# Patient Record
Sex: Male | Born: 1935 | Race: White | Hispanic: No | Marital: Married | Smoking: Never smoker
Health system: Southern US, Community
[De-identification: ages and names within clinical notes are randomized; demographics above are authoritative.]

## PROBLEM LIST (undated history)

## (undated) DIAGNOSIS — R188 Other ascites: Secondary | ICD-10-CM

## (undated) DIAGNOSIS — K7460 Unspecified cirrhosis of liver: Secondary | ICD-10-CM

## (undated) DIAGNOSIS — R112 Nausea with vomiting, unspecified: Secondary | ICD-10-CM

## (undated) DIAGNOSIS — Z9889 Other specified postprocedural states: Secondary | ICD-10-CM

## (undated) DIAGNOSIS — C439 Malignant melanoma of skin, unspecified: Secondary | ICD-10-CM

## (undated) DIAGNOSIS — D474 Osteomyelofibrosis: Secondary | ICD-10-CM

## (undated) HISTORY — PX: HERNIA REPAIR: SHX51

## (undated) HISTORY — PX: SPINE SURGERY: SHX786

## (undated) HISTORY — DX: Osteomyelofibrosis: D47.4

## (undated) HISTORY — DX: Unspecified cirrhosis of liver: K74.60

## (undated) HISTORY — DX: Malignant melanoma of skin, unspecified: C43.9

---

## 2000-02-01 ENCOUNTER — Encounter: Payer: Self-pay | Admitting: Neurosurgery

## 2000-02-02 ENCOUNTER — Inpatient Hospital Stay (HOSPITAL_COMMUNITY): Admission: RE | Admit: 2000-02-02 | Discharge: 2000-02-04 | Payer: Self-pay | Admitting: Neurosurgery

## 2000-02-02 ENCOUNTER — Encounter: Payer: Self-pay | Admitting: Neurosurgery

## 2000-02-28 ENCOUNTER — Encounter: Payer: Self-pay | Admitting: Neurosurgery

## 2004-05-24 ENCOUNTER — Ambulatory Visit: Payer: Self-pay | Admitting: Internal Medicine

## 2006-07-10 ENCOUNTER — Other Ambulatory Visit: Payer: Self-pay

## 2006-07-10 ENCOUNTER — Ambulatory Visit: Payer: Self-pay | Admitting: Urology

## 2006-07-16 ENCOUNTER — Ambulatory Visit: Payer: Self-pay | Admitting: Urology

## 2007-05-15 ENCOUNTER — Ambulatory Visit: Payer: Self-pay | Admitting: Gastroenterology

## 2008-02-24 ENCOUNTER — Ambulatory Visit: Payer: Self-pay | Admitting: Dermatology

## 2008-03-07 ENCOUNTER — Ambulatory Visit: Payer: Self-pay | Admitting: Internal Medicine

## 2008-03-09 ENCOUNTER — Ambulatory Visit: Payer: Self-pay | Admitting: Internal Medicine

## 2008-04-07 ENCOUNTER — Ambulatory Visit: Payer: Self-pay | Admitting: Internal Medicine

## 2008-05-07 ENCOUNTER — Ambulatory Visit: Payer: Self-pay | Admitting: Internal Medicine

## 2008-06-07 ENCOUNTER — Ambulatory Visit: Payer: Self-pay | Admitting: Internal Medicine

## 2008-07-07 ENCOUNTER — Ambulatory Visit: Payer: Self-pay | Admitting: Internal Medicine

## 2008-08-07 ENCOUNTER — Ambulatory Visit: Payer: Self-pay | Admitting: Internal Medicine

## 2008-09-07 ENCOUNTER — Ambulatory Visit: Payer: Self-pay | Admitting: Internal Medicine

## 2008-10-05 ENCOUNTER — Ambulatory Visit: Payer: Self-pay | Admitting: Internal Medicine

## 2008-10-14 ENCOUNTER — Ambulatory Visit: Payer: Self-pay | Admitting: Internal Medicine

## 2008-11-05 ENCOUNTER — Ambulatory Visit: Payer: Self-pay | Admitting: Internal Medicine

## 2008-12-05 ENCOUNTER — Ambulatory Visit: Payer: Self-pay | Admitting: Internal Medicine

## 2009-01-05 ENCOUNTER — Ambulatory Visit: Payer: Self-pay | Admitting: Internal Medicine

## 2009-02-04 ENCOUNTER — Ambulatory Visit: Payer: Self-pay | Admitting: Internal Medicine

## 2009-03-07 ENCOUNTER — Ambulatory Visit: Payer: Self-pay | Admitting: Internal Medicine

## 2009-04-07 ENCOUNTER — Ambulatory Visit: Payer: Self-pay | Admitting: Internal Medicine

## 2009-05-07 ENCOUNTER — Ambulatory Visit: Payer: Self-pay | Admitting: Internal Medicine

## 2009-06-07 ENCOUNTER — Ambulatory Visit: Payer: Self-pay | Admitting: Internal Medicine

## 2009-07-07 ENCOUNTER — Ambulatory Visit: Payer: Self-pay | Admitting: Internal Medicine

## 2009-07-08 ENCOUNTER — Ambulatory Visit: Payer: Self-pay | Admitting: Internal Medicine

## 2009-08-07 ENCOUNTER — Ambulatory Visit: Payer: Self-pay | Admitting: Internal Medicine

## 2009-09-07 ENCOUNTER — Ambulatory Visit: Payer: Self-pay | Admitting: Internal Medicine

## 2009-10-05 ENCOUNTER — Ambulatory Visit: Payer: Self-pay | Admitting: Internal Medicine

## 2009-11-05 ENCOUNTER — Ambulatory Visit: Payer: Self-pay | Admitting: Internal Medicine

## 2009-11-11 ENCOUNTER — Ambulatory Visit: Payer: Self-pay | Admitting: Internal Medicine

## 2009-12-05 ENCOUNTER — Ambulatory Visit: Payer: Self-pay | Admitting: Internal Medicine

## 2010-01-05 ENCOUNTER — Ambulatory Visit: Payer: Self-pay | Admitting: Internal Medicine

## 2010-02-04 ENCOUNTER — Ambulatory Visit: Payer: Self-pay | Admitting: Internal Medicine

## 2010-03-07 ENCOUNTER — Ambulatory Visit: Payer: Self-pay | Admitting: Internal Medicine

## 2010-03-16 ENCOUNTER — Ambulatory Visit: Payer: Self-pay | Admitting: Internal Medicine

## 2010-04-07 ENCOUNTER — Ambulatory Visit: Payer: Self-pay | Admitting: Internal Medicine

## 2010-05-07 ENCOUNTER — Ambulatory Visit: Payer: Self-pay | Admitting: Internal Medicine

## 2010-06-09 ENCOUNTER — Ambulatory Visit: Payer: Self-pay | Admitting: Internal Medicine

## 2010-07-07 ENCOUNTER — Ambulatory Visit: Payer: Self-pay | Admitting: Internal Medicine

## 2010-08-07 ENCOUNTER — Ambulatory Visit: Payer: Self-pay | Admitting: Internal Medicine

## 2010-09-07 ENCOUNTER — Ambulatory Visit: Payer: Self-pay | Admitting: Internal Medicine

## 2010-10-13 ENCOUNTER — Ambulatory Visit: Payer: Self-pay | Admitting: Internal Medicine

## 2010-11-06 ENCOUNTER — Ambulatory Visit: Payer: Self-pay | Admitting: Internal Medicine

## 2010-12-06 ENCOUNTER — Ambulatory Visit: Payer: Self-pay | Admitting: Internal Medicine

## 2011-01-06 ENCOUNTER — Ambulatory Visit: Payer: Self-pay | Admitting: Oncology

## 2011-01-06 ENCOUNTER — Ambulatory Visit: Payer: Self-pay | Admitting: Internal Medicine

## 2011-02-15 ENCOUNTER — Ambulatory Visit: Payer: Self-pay | Admitting: Internal Medicine

## 2011-03-08 ENCOUNTER — Ambulatory Visit: Payer: Self-pay | Admitting: Internal Medicine

## 2011-04-08 ENCOUNTER — Ambulatory Visit: Payer: Self-pay | Admitting: Internal Medicine

## 2011-05-08 ENCOUNTER — Ambulatory Visit: Payer: Self-pay | Admitting: Internal Medicine

## 2011-06-08 ENCOUNTER — Ambulatory Visit: Payer: Self-pay | Admitting: Internal Medicine

## 2011-07-08 ENCOUNTER — Ambulatory Visit: Payer: Self-pay | Admitting: Internal Medicine

## 2011-08-16 ENCOUNTER — Ambulatory Visit: Payer: Self-pay | Admitting: Internal Medicine

## 2011-08-16 LAB — CBC CANCER CENTER
Eosinophil %: 0.8 %
HCT: 31.2 % — ABNORMAL LOW (ref 40.0–52.0)
HGB: 10.5 g/dL — ABNORMAL LOW (ref 13.0–18.0)
Lymphocyte #: 1.7 x10 3/mm (ref 1.0–3.6)
Lymphocyte %: 16.4 %
MCH: 27.7 pg (ref 26.0–34.0)
MCV: 82 fL (ref 80–100)
Monocyte #: 0.6 x10 3/mm (ref 0.0–0.7)
Monocyte %: 5.8 %
Neutrophil #: 8 x10 3/mm — ABNORMAL HIGH (ref 1.4–6.5)
RBC: 3.8 10*6/uL — ABNORMAL LOW (ref 4.40–5.90)
RDW: 19.5 % — ABNORMAL HIGH (ref 11.5–14.5)
WBC: 10.5 x10 3/mm (ref 3.8–10.6)

## 2011-08-16 LAB — IRON AND TIBC
Iron Bind.Cap.(Total): 311 ug/dL (ref 250–450)
Unbound Iron-Bind.Cap.: 213 ug/dL

## 2011-09-08 ENCOUNTER — Ambulatory Visit: Payer: Self-pay | Admitting: Internal Medicine

## 2011-10-04 LAB — CBC CANCER CENTER
Basophil #: 0 x10 3/mm (ref 0.0–0.1)
Eosinophil #: 0.1 x10 3/mm (ref 0.0–0.7)
Eosinophil %: 0.7 %
HCT: 33 % — ABNORMAL LOW (ref 40.0–52.0)
HGB: 11.1 g/dL — ABNORMAL LOW (ref 13.0–18.0)
Lymphocyte #: 2.1 x10 3/mm (ref 1.0–3.6)
Lymphocyte %: 15.9 %
MCHC: 33.7 g/dL (ref 32.0–36.0)
Monocyte #: 0.7 x10 3/mm (ref 0.0–0.7)
Neutrophil %: 77.5 %
Platelet: 121 x10 3/mm — ABNORMAL LOW (ref 150–440)
RBC: 4.01 10*6/uL — ABNORMAL LOW (ref 4.40–5.90)
RDW: 20 % — ABNORMAL HIGH (ref 11.5–14.5)
WBC: 13.1 x10 3/mm — ABNORMAL HIGH (ref 3.8–10.6)

## 2011-10-06 ENCOUNTER — Ambulatory Visit: Payer: Self-pay | Admitting: Internal Medicine

## 2011-11-15 ENCOUNTER — Ambulatory Visit: Payer: Self-pay | Admitting: Oncology

## 2011-11-15 LAB — CANCER CENTER HEMOGLOBIN: HGB: 10.5 g/dL — ABNORMAL LOW (ref 13.0–18.0)

## 2011-12-06 ENCOUNTER — Ambulatory Visit: Payer: Self-pay | Admitting: Oncology

## 2012-01-03 LAB — COMPREHENSIVE METABOLIC PANEL
Albumin: 3.7 g/dL (ref 3.4–5.0)
Alkaline Phosphatase: 96 U/L (ref 50–136)
Bilirubin,Total: 0.5 mg/dL (ref 0.2–1.0)
Chloride: 105 mmol/L (ref 98–107)
Co2: 26 mmol/L (ref 21–32)
EGFR (African American): 52 — ABNORMAL LOW
EGFR (Non-African Amer.): 45 — ABNORMAL LOW
Osmolality: 289 (ref 275–301)
Potassium: 4.3 mmol/L (ref 3.5–5.1)
SGPT (ALT): 40 U/L

## 2012-01-03 LAB — CBC CANCER CENTER
Basophil %: 0.9 %
Eosinophil #: 0.1 x10 3/mm (ref 0.0–0.7)
Eosinophil %: 1.1 %
Lymphocyte %: 14.9 %
MCH: 26.7 pg (ref 26.0–34.0)
MCV: 82 fL (ref 80–100)
Neutrophil #: 7 x10 3/mm — ABNORMAL HIGH (ref 1.4–6.5)
Platelet: 122 x10 3/mm — ABNORMAL LOW (ref 150–440)
RBC: 3.84 10*6/uL — ABNORMAL LOW (ref 4.40–5.90)

## 2012-01-06 ENCOUNTER — Ambulatory Visit: Payer: Self-pay | Admitting: Oncology

## 2012-02-15 ENCOUNTER — Ambulatory Visit: Payer: Self-pay | Admitting: Oncology

## 2012-02-15 LAB — CBC CANCER CENTER
Basophil #: 0.1 x10 3/mm (ref 0.0–0.1)
Basophil %: 0.7 %
Eosinophil #: 0.1 x10 3/mm (ref 0.0–0.7)
Eosinophil %: 1.3 %
HCT: 32.4 % — ABNORMAL LOW (ref 40.0–52.0)
HGB: 10.5 g/dL — ABNORMAL LOW (ref 13.0–18.0)
Lymphocyte #: 1.4 x10 3/mm (ref 1.0–3.6)
Lymphocyte %: 15.2 %
MCH: 26.5 pg (ref 26.0–34.0)
MCHC: 32.3 g/dL (ref 32.0–36.0)
MCV: 82 fL (ref 80–100)
Monocyte #: 0.6 x10 3/mm (ref 0.2–1.0)
Monocyte %: 6.5 %
Neutrophil #: 6.8 x10 3/mm — ABNORMAL HIGH (ref 1.4–6.5)
Neutrophil %: 76.3 %
Platelet: 124 x10 3/mm — ABNORMAL LOW (ref 150–440)
RBC: 3.95 10*6/uL — ABNORMAL LOW (ref 4.40–5.90)
RDW: 19.3 % — ABNORMAL HIGH (ref 11.5–14.5)
WBC: 8.9 x10 3/mm (ref 3.8–10.6)

## 2012-02-15 LAB — BASIC METABOLIC PANEL
Anion Gap: 12 (ref 7–16)
BUN: 27 mg/dL — ABNORMAL HIGH (ref 7–18)
Calcium, Total: 8.9 mg/dL (ref 8.5–10.1)
Chloride: 104 mmol/L (ref 98–107)
Co2: 26 mmol/L (ref 21–32)
Creatinine: 1.6 mg/dL — ABNORMAL HIGH (ref 0.60–1.30)
EGFR (African American): 47 — ABNORMAL LOW
EGFR (Non-African Amer.): 40 — ABNORMAL LOW
Glucose: 108 mg/dL — ABNORMAL HIGH (ref 65–99)
Osmolality: 289 (ref 275–301)
Potassium: 4 mmol/L (ref 3.5–5.1)
Sodium: 142 mmol/L (ref 136–145)

## 2012-03-07 ENCOUNTER — Ambulatory Visit: Payer: Self-pay | Admitting: Oncology

## 2012-03-25 LAB — CBC CANCER CENTER
Basophil #: 0.2 x10 3/mm — ABNORMAL HIGH (ref 0.0–0.1)
Basophil %: 0.9 %
Eosinophil #: 0.1 x10 3/mm (ref 0.0–0.7)
Eosinophil %: 0.4 %
HGB: 11.5 g/dL — ABNORMAL LOW (ref 13.0–18.0)
Lymphocyte #: 2 x10 3/mm (ref 1.0–3.6)
Lymphocyte %: 11.9 %
MCH: 26.2 pg (ref 26.0–34.0)
MCHC: 32 g/dL (ref 32.0–36.0)
MCV: 82 fL (ref 80–100)
Monocyte #: 0.9 x10 3/mm (ref 0.2–1.0)
Neutrophil %: 81.7 %
Platelet: 107 x10 3/mm — ABNORMAL LOW (ref 150–440)
RBC: 4.38 10*6/uL — ABNORMAL LOW (ref 4.40–5.90)
RDW: 19.6 % — ABNORMAL HIGH (ref 11.5–14.5)
WBC: 17.1 x10 3/mm — ABNORMAL HIGH (ref 3.8–10.6)

## 2012-03-25 LAB — COMPREHENSIVE METABOLIC PANEL
Albumin: 3.7 g/dL (ref 3.4–5.0)
Anion Gap: 10 (ref 7–16)
BUN: 22 mg/dL — ABNORMAL HIGH (ref 7–18)
Bilirubin,Total: 0.5 mg/dL (ref 0.2–1.0)
Chloride: 103 mmol/L (ref 98–107)
Creatinine: 1.62 mg/dL — ABNORMAL HIGH (ref 0.60–1.30)
EGFR (Non-African Amer.): 39 — ABNORMAL LOW
Osmolality: 287 (ref 275–301)
SGOT(AST): 22 U/L (ref 15–37)
SGPT (ALT): 37 U/L (ref 12–78)
Total Protein: 7 g/dL (ref 6.4–8.2)

## 2012-04-07 ENCOUNTER — Ambulatory Visit: Payer: Self-pay | Admitting: Oncology

## 2012-05-08 ENCOUNTER — Ambulatory Visit: Payer: Self-pay | Admitting: Oncology

## 2012-05-08 LAB — CBC CANCER CENTER
Basophil #: 0.1 x10 3/mm (ref 0.0–0.1)
Eosinophil #: 0.1 x10 3/mm (ref 0.0–0.7)
Eosinophil %: 0.3 %
Lymphocyte #: 2.3 x10 3/mm (ref 1.0–3.6)
Lymphocyte %: 10.7 %
MCHC: 32.3 g/dL (ref 32.0–36.0)
MCV: 83 fL (ref 80–100)
Monocyte %: 4.7 %
Neutrophil #: 18 x10 3/mm — ABNORMAL HIGH (ref 1.4–6.5)
Platelet: 105 x10 3/mm — ABNORMAL LOW (ref 150–440)
RBC: 4.52 10*6/uL (ref 4.40–5.90)
WBC: 21.5 x10 3/mm — ABNORMAL HIGH (ref 3.8–10.6)

## 2012-05-08 LAB — COMPREHENSIVE METABOLIC PANEL
Albumin: 3.9 g/dL (ref 3.4–5.0)
Anion Gap: 12 (ref 7–16)
BUN: 26 mg/dL — ABNORMAL HIGH (ref 7–18)
Chloride: 101 mmol/L (ref 98–107)
Co2: 25 mmol/L (ref 21–32)
Creatinine: 1.59 mg/dL — ABNORMAL HIGH (ref 0.60–1.30)
Glucose: 151 mg/dL — ABNORMAL HIGH (ref 65–99)
Osmolality: 283 (ref 275–301)
Potassium: 3.9 mmol/L (ref 3.5–5.1)
SGOT(AST): 24 U/L (ref 15–37)
Sodium: 138 mmol/L (ref 136–145)
Total Protein: 7.4 g/dL (ref 6.4–8.2)

## 2012-06-07 ENCOUNTER — Ambulatory Visit: Payer: Self-pay | Admitting: Oncology

## 2012-06-19 LAB — COMPREHENSIVE METABOLIC PANEL
Albumin: 3.7 g/dL (ref 3.4–5.0)
Alkaline Phosphatase: 100 U/L (ref 50–136)
Anion Gap: 11 (ref 7–16)
Calcium, Total: 8.7 mg/dL (ref 8.5–10.1)
Co2: 25 mmol/L (ref 21–32)
EGFR (African American): 48 — ABNORMAL LOW
Glucose: 117 mg/dL — ABNORMAL HIGH (ref 65–99)
Potassium: 4.1 mmol/L (ref 3.5–5.1)
SGOT(AST): 28 U/L (ref 15–37)
SGPT (ALT): 29 U/L (ref 12–78)
Sodium: 138 mmol/L (ref 136–145)

## 2012-06-19 LAB — CBC CANCER CENTER
Basophil: 2 %
Eosinophil: 1 %
HCT: 33.8 % — ABNORMAL LOW (ref 40.0–52.0)
HGB: 10.9 g/dL — ABNORMAL LOW (ref 13.0–18.0)
MCHC: 32.3 g/dL (ref 32.0–36.0)
Metamyelocyte: 6 %
Monocytes: 4 %
Myelocyte: 9 %
Promyelocyte: 2 %
RDW: 18.8 % — ABNORMAL HIGH (ref 11.5–14.5)
WBC: 12.2 x10 3/mm — ABNORMAL HIGH (ref 3.8–10.6)

## 2012-07-07 ENCOUNTER — Ambulatory Visit: Payer: Self-pay | Admitting: Oncology

## 2012-07-24 LAB — CBC CANCER CENTER
Bands: 24 %
Eosinophil: 1 %
HCT: 33.5 % — ABNORMAL LOW (ref 40.0–52.0)
Lymphocytes: 8 %
MCHC: 33 g/dL (ref 32.0–36.0)
MCV: 82 fL (ref 80–100)
Metamyelocyte: 9 %
Myelocyte: 4 %
Platelet: 106 x10 3/mm — ABNORMAL LOW (ref 150–440)
Promyelocyte: 1 %
RBC: 4.1 10*6/uL — ABNORMAL LOW (ref 4.40–5.90)
WBC: 20.9 x10 3/mm — ABNORMAL HIGH (ref 3.8–10.6)

## 2012-07-24 LAB — COMPREHENSIVE METABOLIC PANEL
Albumin: 3.8 g/dL (ref 3.4–5.0)
BUN: 23 mg/dL — ABNORMAL HIGH (ref 7–18)
Bilirubin,Total: 0.4 mg/dL (ref 0.2–1.0)
Co2: 25 mmol/L (ref 21–32)
Creatinine: 1.64 mg/dL — ABNORMAL HIGH (ref 0.60–1.30)
Glucose: 114 mg/dL — ABNORMAL HIGH (ref 65–99)
Potassium: 3.9 mmol/L (ref 3.5–5.1)
SGOT(AST): 30 U/L (ref 15–37)
SGPT (ALT): 31 U/L (ref 12–78)
Total Protein: 7.5 g/dL (ref 6.4–8.2)

## 2012-08-07 ENCOUNTER — Ambulatory Visit: Payer: Self-pay | Admitting: Oncology

## 2012-10-05 ENCOUNTER — Ambulatory Visit: Payer: Self-pay | Admitting: Oncology

## 2012-10-16 LAB — COMPREHENSIVE METABOLIC PANEL
Alkaline Phosphatase: 96 U/L (ref 50–136)
Anion Gap: 11 (ref 7–16)
Bilirubin,Total: 0.5 mg/dL (ref 0.2–1.0)
Calcium, Total: 8.5 mg/dL (ref 8.5–10.1)
Co2: 26 mmol/L (ref 21–32)
EGFR (African American): 51 — ABNORMAL LOW
Potassium: 4 mmol/L (ref 3.5–5.1)
SGOT(AST): 30 U/L (ref 15–37)
SGPT (ALT): 34 U/L (ref 12–78)
Sodium: 140 mmol/L (ref 136–145)

## 2012-10-16 LAB — CBC CANCER CENTER
Basophil #: 0.2 x10 3/mm — ABNORMAL HIGH (ref 0.0–0.1)
Basophil %: 1.4 %
Eosinophil %: 1.1 %
HCT: 33.7 % — ABNORMAL LOW (ref 40.0–52.0)
HGB: 10.9 g/dL — ABNORMAL LOW (ref 13.0–18.0)
Lymphocyte #: 1.4 x10 3/mm (ref 1.0–3.6)
Lymphocyte %: 12.8 %
MCH: 26.3 pg (ref 26.0–34.0)
MCV: 81 fL (ref 80–100)
Monocyte #: 0.7 x10 3/mm (ref 0.2–1.0)
Monocyte %: 6.6 %
Neutrophil #: 8.7 x10 3/mm — ABNORMAL HIGH (ref 1.4–6.5)
Neutrophil %: 78.1 %
Platelet: 114 x10 3/mm — ABNORMAL LOW (ref 150–440)
RBC: 4.14 10*6/uL — ABNORMAL LOW (ref 4.40–5.90)

## 2012-11-05 ENCOUNTER — Ambulatory Visit: Payer: Self-pay | Admitting: Oncology

## 2013-01-05 ENCOUNTER — Ambulatory Visit: Payer: Self-pay | Admitting: Oncology

## 2013-01-15 LAB — COMPREHENSIVE METABOLIC PANEL
Albumin: 3.6 g/dL (ref 3.4–5.0)
Alkaline Phosphatase: 101 U/L (ref 50–136)
Anion Gap: 8 (ref 7–16)
BUN: 24 mg/dL — ABNORMAL HIGH (ref 7–18)
Bilirubin,Total: 0.4 mg/dL (ref 0.2–1.0)
Chloride: 104 mmol/L (ref 98–107)
Co2: 27 mmol/L (ref 21–32)
SGOT(AST): 29 U/L (ref 15–37)
SGPT (ALT): 30 U/L (ref 12–78)
Total Protein: 7 g/dL (ref 6.4–8.2)

## 2013-01-15 LAB — CBC CANCER CENTER
Basophil #: 0 x10 3/mm (ref 0.0–0.1)
Basophil %: 0.2 %
Eosinophil #: 0.2 x10 3/mm (ref 0.0–0.7)
Eosinophil %: 1.2 %
HCT: 33.2 % — ABNORMAL LOW (ref 40.0–52.0)
Lymphocyte %: 11 %
MCH: 26.6 pg (ref 26.0–34.0)
MCV: 80 fL (ref 80–100)
Neutrophil #: 12.4 x10 3/mm — ABNORMAL HIGH (ref 1.4–6.5)
Platelet: 130 x10 3/mm — ABNORMAL LOW (ref 150–440)
RBC: 4.13 10*6/uL — ABNORMAL LOW (ref 4.40–5.90)
RDW: 18.6 % — ABNORMAL HIGH (ref 11.5–14.5)
WBC: 15.1 x10 3/mm — ABNORMAL HIGH (ref 3.8–10.6)

## 2013-02-04 ENCOUNTER — Ambulatory Visit: Payer: Self-pay | Admitting: Oncology

## 2013-04-09 ENCOUNTER — Ambulatory Visit: Payer: Self-pay | Admitting: Otolaryngology

## 2013-04-24 ENCOUNTER — Ambulatory Visit: Payer: Self-pay | Admitting: Otolaryngology

## 2013-05-22 ENCOUNTER — Ambulatory Visit: Payer: Self-pay | Admitting: Oncology

## 2013-05-22 LAB — CBC CANCER CENTER
Blast: 1 %
Eosinophil: 1 %
HCT: 33 % — ABNORMAL LOW (ref 40.0–52.0)
HGB: 11.2 g/dL — ABNORMAL LOW (ref 13.0–18.0)
Lymphocytes: 13 %
MCH: 26.5 pg (ref 26.0–34.0)
MCHC: 33.9 g/dL (ref 32.0–36.0)
Metamyelocyte: 5 %
Monocytes: 7 %
NRBC/100 WBC: 2 /100
Promyelocyte: 2 %
RBC: 4.21 10*6/uL — ABNORMAL LOW (ref 4.40–5.90)
RDW: 18.7 % — ABNORMAL HIGH (ref 11.5–14.5)
Segmented Neutrophils: 40 %
WBC: 20.1 x10 3/mm — ABNORMAL HIGH (ref 3.8–10.6)

## 2013-05-22 LAB — COMPREHENSIVE METABOLIC PANEL
Alkaline Phosphatase: 98 U/L (ref 50–136)
Anion Gap: 4 — ABNORMAL LOW (ref 7–16)
Calcium, Total: 9 mg/dL (ref 8.5–10.1)
Co2: 27 mmol/L (ref 21–32)
Creatinine: 1.48 mg/dL — ABNORMAL HIGH (ref 0.60–1.30)
EGFR (Non-African Amer.): 44 — ABNORMAL LOW
Glucose: 130 mg/dL — ABNORMAL HIGH (ref 65–99)
Osmolality: 277 (ref 275–301)
Potassium: 3.9 mmol/L (ref 3.5–5.1)
SGOT(AST): 38 U/L — ABNORMAL HIGH (ref 15–37)
SGPT (ALT): 32 U/L (ref 12–78)
Total Protein: 7 g/dL (ref 6.4–8.2)

## 2013-06-07 ENCOUNTER — Ambulatory Visit: Payer: Self-pay | Admitting: Oncology

## 2013-09-24 ENCOUNTER — Ambulatory Visit: Payer: Self-pay | Admitting: Oncology

## 2013-09-24 LAB — CBC CANCER CENTER
BASOS ABS: 0.5 x10 3/mm — AB (ref 0.0–0.1)
Basophil %: 2.3 %
EOS PCT: 1.2 %
Eosinophil #: 0.3 x10 3/mm (ref 0.0–0.7)
HCT: 34.3 % — ABNORMAL LOW (ref 40.0–52.0)
HGB: 10.8 g/dL — ABNORMAL LOW (ref 13.0–18.0)
LYMPHS ABS: 1.9 x10 3/mm (ref 1.0–3.6)
Lymphocyte %: 8.8 %
MCH: 25.3 pg — AB (ref 26.0–34.0)
MCHC: 31.5 g/dL — ABNORMAL LOW (ref 32.0–36.0)
MCV: 80 fL (ref 80–100)
MONOS PCT: 5.8 %
Monocyte #: 1.2 x10 3/mm — ABNORMAL HIGH (ref 0.2–1.0)
Neutrophil #: 17.4 x10 3/mm — ABNORMAL HIGH (ref 1.4–6.5)
Neutrophil %: 81.9 %
Platelet: 124 x10 3/mm — ABNORMAL LOW (ref 150–440)
RBC: 4.28 10*6/uL — ABNORMAL LOW (ref 4.40–5.90)
RDW: 18.5 % — AB (ref 11.5–14.5)
WBC: 21.2 x10 3/mm — ABNORMAL HIGH (ref 3.8–10.6)

## 2013-09-24 LAB — COMPREHENSIVE METABOLIC PANEL
ANION GAP: 10 (ref 7–16)
Albumin: 3.4 g/dL (ref 3.4–5.0)
Alkaline Phosphatase: 111 U/L
BUN: 20 mg/dL — ABNORMAL HIGH (ref 7–18)
Bilirubin,Total: 0.3 mg/dL (ref 0.2–1.0)
CALCIUM: 8 mg/dL — AB (ref 8.5–10.1)
Chloride: 102 mmol/L (ref 98–107)
Co2: 26 mmol/L (ref 21–32)
Creatinine: 1.53 mg/dL — ABNORMAL HIGH (ref 0.60–1.30)
EGFR (African American): 49 — ABNORMAL LOW
EGFR (Non-African Amer.): 42 — ABNORMAL LOW
Glucose: 140 mg/dL — ABNORMAL HIGH (ref 65–99)
OSMOLALITY: 281 (ref 275–301)
Potassium: 4.3 mmol/L (ref 3.5–5.1)
SGOT(AST): 36 U/L (ref 15–37)
SGPT (ALT): 40 U/L (ref 12–78)
Sodium: 138 mmol/L (ref 136–145)
Total Protein: 6.9 g/dL (ref 6.4–8.2)

## 2013-10-05 ENCOUNTER — Ambulatory Visit: Payer: Self-pay | Admitting: Oncology

## 2014-02-05 ENCOUNTER — Ambulatory Visit: Payer: Self-pay | Admitting: Oncology

## 2014-02-05 LAB — CBC CANCER CENTER
BASOS PCT: 1.9 %
Basophil #: 0.5 x10 3/mm — ABNORMAL HIGH (ref 0.0–0.1)
Eosinophil #: 0.4 x10 3/mm (ref 0.0–0.7)
Eosinophil %: 1.7 %
HCT: 34.6 % — AB (ref 40.0–52.0)
HGB: 11.2 g/dL — ABNORMAL LOW (ref 13.0–18.0)
Lymphocyte #: 2.5 x10 3/mm (ref 1.0–3.6)
Lymphocyte %: 10 %
MCH: 25.8 pg — ABNORMAL LOW (ref 26.0–34.0)
MCHC: 32.4 g/dL (ref 32.0–36.0)
MCV: 79 fL — AB (ref 80–100)
MONOS PCT: 5.6 %
Monocyte #: 1.4 x10 3/mm — ABNORMAL HIGH (ref 0.2–1.0)
NEUTROS ABS: 20 x10 3/mm — AB (ref 1.4–6.5)
Neutrophil %: 80.8 %
Platelet: 126 x10 3/mm — ABNORMAL LOW (ref 150–440)
RBC: 4.36 10*6/uL — ABNORMAL LOW (ref 4.40–5.90)
RDW: 18.9 % — ABNORMAL HIGH (ref 11.5–14.5)
WBC: 24.8 x10 3/mm — ABNORMAL HIGH (ref 3.8–10.6)

## 2014-02-05 LAB — COMPREHENSIVE METABOLIC PANEL
ALBUMIN: 3.5 g/dL (ref 3.4–5.0)
ALT: 43 U/L (ref 12–78)
Alkaline Phosphatase: 118 U/L — ABNORMAL HIGH
Anion Gap: 7 (ref 7–16)
BUN: 23 mg/dL — ABNORMAL HIGH (ref 7–18)
Bilirubin,Total: 0.4 mg/dL (ref 0.2–1.0)
CALCIUM: 8.7 mg/dL (ref 8.5–10.1)
CHLORIDE: 104 mmol/L (ref 98–107)
Co2: 28 mmol/L (ref 21–32)
Creatinine: 1.47 mg/dL — ABNORMAL HIGH (ref 0.60–1.30)
EGFR (African American): 51 — ABNORMAL LOW
GFR CALC NON AF AMER: 44 — AB
Glucose: 107 mg/dL — ABNORMAL HIGH (ref 65–99)
OSMOLALITY: 282 (ref 275–301)
Potassium: 4.3 mmol/L (ref 3.5–5.1)
SGOT(AST): 42 U/L — ABNORMAL HIGH (ref 15–37)
Sodium: 139 mmol/L (ref 136–145)
Total Protein: 7.2 g/dL (ref 6.4–8.2)

## 2014-03-07 ENCOUNTER — Ambulatory Visit: Payer: Self-pay | Admitting: Oncology

## 2014-08-12 ENCOUNTER — Ambulatory Visit: Payer: Self-pay | Admitting: Oncology

## 2014-08-12 LAB — COMPREHENSIVE METABOLIC PANEL
ALK PHOS: 150 U/L — AB
ALT: 43 U/L
Albumin: 3.3 g/dL — ABNORMAL LOW (ref 3.4–5.0)
Anion Gap: 10 (ref 7–16)
BILIRUBIN TOTAL: 0.4 mg/dL (ref 0.2–1.0)
BUN: 22 mg/dL — ABNORMAL HIGH (ref 7–18)
CALCIUM: 8.4 mg/dL — AB (ref 8.5–10.1)
CHLORIDE: 104 mmol/L (ref 98–107)
CO2: 25 mmol/L (ref 21–32)
CREATININE: 1.32 mg/dL — AB (ref 0.60–1.30)
GFR CALC NON AF AMER: 55 — AB
Glucose: 123 mg/dL — ABNORMAL HIGH (ref 65–99)
Osmolality: 282 (ref 275–301)
Potassium: 4 mmol/L (ref 3.5–5.1)
SGOT(AST): 40 U/L — ABNORMAL HIGH (ref 15–37)
Sodium: 139 mmol/L (ref 136–145)
Total Protein: 6.8 g/dL (ref 6.4–8.2)

## 2014-08-12 LAB — CBC CANCER CENTER
BANDS NEUTROPHIL: 11 %
Basophil: 3 %
Eosinophil: 1 %
HCT: 32.1 % — ABNORMAL LOW (ref 40.0–52.0)
HGB: 10.3 g/dL — ABNORMAL LOW (ref 13.0–18.0)
Lymphocytes: 9 %
MCH: 24.8 pg — ABNORMAL LOW (ref 26.0–34.0)
MCHC: 32.2 g/dL (ref 32.0–36.0)
MCV: 77 fL — ABNORMAL LOW (ref 80–100)
MYELOCYTE: 8 %
Metamyelocyte: 6 %
Monocytes: 2 %
NRBC/100 WBC: 4 /100
OTHER CELLS BLOOD: 2 %
PLATELETS: 122 x10 3/mm — AB (ref 150–440)
Promyelocyte: 2 %
RBC: 4.17 10*6/uL — AB (ref 4.40–5.90)
RDW: 19.2 % — ABNORMAL HIGH (ref 11.5–14.5)
Segmented Neutrophils: 54 %
Variant Lymphocyte: 2 %
WBC: 25.9 x10 3/mm — ABNORMAL HIGH (ref 3.8–10.6)

## 2014-09-07 ENCOUNTER — Ambulatory Visit: Payer: Self-pay | Admitting: Oncology

## 2014-11-23 ENCOUNTER — Ambulatory Visit
Admit: 2014-11-23 | Disposition: A | Discharge status: Home / Self Care | Payer: Self-pay | Attending: Oncology | Admitting: Oncology

## 2014-11-23 LAB — COMPREHENSIVE METABOLIC PANEL
ALT: 22 U/L
AST: 27 U/L
Albumin: 3.7 g/dL
Alkaline Phosphatase: 120 U/L
Anion Gap: 10 (ref 7–16)
BUN: 20 mg/dL
Bilirubin,Total: 0.8 mg/dL
CALCIUM: 8.5 mg/dL — AB
Chloride: 102 mmol/L
Co2: 24 mmol/L
Creatinine: 1.25 mg/dL — ABNORMAL HIGH
EGFR (African American): >60
GFR CALC NON AF AMER: 53 — AB
Glucose: 114 mg/dL — ABNORMAL HIGH
Potassium: 4.7 mmol/L
SODIUM: 136 mmol/L
TOTAL PROTEIN: 7 g/dL

## 2014-11-23 LAB — CBC CANCER CENTER
BASOS ABS: 5 %
Bands: 12 %
Blast: 2 %
Eosinophil: 1 %
HCT: 31.7 % — AB (ref 40.0–52.0)
HGB: 10.1 g/dL — ABNORMAL LOW (ref 13.0–18.0)
Lymphocytes: 14 %
MCH: 24.2 pg — AB (ref 26.0–34.0)
MCHC: 31.8 g/dL — ABNORMAL LOW (ref 32.0–36.0)
MCV: 76 fL — AB (ref 80–100)
METAMYELOCYTE: 4 %
MONOS PCT: 7 %
Myelocyte: 13 %
NRBC/100 WBC: 4 /100
Platelet: 112 x10 3/mm — ABNORMAL LOW (ref 150–440)
RBC: 4.17 10*6/uL — AB (ref 4.40–5.90)
RDW: 19.1 % — ABNORMAL HIGH (ref 11.5–14.5)
Segmented Neutrophils: 40 %
VARIANT LYMPHOCYTE - H4-RLYMPH: 2 %
WBC: 30 x10 3/mm — AB (ref 3.8–10.6)

## 2014-11-25 LAB — PROTIME-INR
INR: 1.1
PROTHROMBIN TIME: 14 s

## 2014-11-25 LAB — APTT: ACTIVATED PTT: 40.3 s — AB (ref 23.6–35.9)

## 2014-11-26 ENCOUNTER — Ambulatory Visit
Admit: 2014-11-26 | Disposition: A | Discharge status: Home / Self Care | Payer: Self-pay | Attending: Oncology | Admitting: Oncology

## 2014-11-26 LAB — PROTEIN, BODY FLUID: Protein, Body Fluid: <3 g/dL

## 2014-11-26 LAB — BODY FLUID CELL COUNT WITH DIFFERENTIAL
Basophil: 0 %
Eosinophil: 0 %
Lymphocytes: 57 %
Neutrophils: 9 %
Nucleated Cell Count: 585 /mm3
OTHER CELLS BF: 0 %
Other Mononuclear Cells: 34 %

## 2014-11-26 LAB — CANCER ANTIGEN 19-9: CA 19-9: 7 U/mL (ref 0–35)

## 2014-11-26 LAB — CEA: CEA: 0.9 ng/mL (ref 0.0–4.7)

## 2014-12-01 LAB — CYTOLOGY - NON PAP

## 2014-12-10 ENCOUNTER — Telehealth: Payer: Self-pay | Admitting: *Deleted

## 2014-12-10 NOTE — Telephone Encounter (Signed)
Was told he would get a call after discussing case at conference and he is waiting to hear something back regarding an appt or what is to be done

## 2014-12-10 NOTE — Telephone Encounter (Signed)
See md Monday and then will schedule next appt VO Dr Oliva Bustard / bce Order entered to schedule pt

## 2014-12-14 ENCOUNTER — Ambulatory Visit: Payer: Self-pay | Admitting: Oncology

## 2014-12-15 ENCOUNTER — Other Ambulatory Visit: Payer: Self-pay | Admitting: Oncology

## 2014-12-15 ENCOUNTER — Inpatient Hospital Stay: Payer: Medicare Other | Attending: Oncology | Admitting: Oncology

## 2014-12-15 ENCOUNTER — Other Ambulatory Visit: Payer: Self-pay | Admitting: *Deleted

## 2014-12-15 VITALS — BP 131/66 | HR 84 | Temp 95.7°F | Wt 174.6 lb

## 2014-12-15 DIAGNOSIS — R188 Other ascites: Secondary | ICD-10-CM

## 2014-12-15 DIAGNOSIS — I2510 Atherosclerotic heart disease of native coronary artery without angina pectoris: Secondary | ICD-10-CM

## 2014-12-15 DIAGNOSIS — I517 Cardiomegaly: Secondary | ICD-10-CM | POA: Diagnosis not present

## 2014-12-15 DIAGNOSIS — R630 Anorexia: Secondary | ICD-10-CM | POA: Insufficient documentation

## 2014-12-15 DIAGNOSIS — R109 Unspecified abdominal pain: Secondary | ICD-10-CM | POA: Insufficient documentation

## 2014-12-15 DIAGNOSIS — R5383 Other fatigue: Secondary | ICD-10-CM | POA: Insufficient documentation

## 2014-12-15 DIAGNOSIS — R972 Elevated prostate specific antigen [PSA]: Secondary | ICD-10-CM | POA: Insufficient documentation

## 2014-12-15 DIAGNOSIS — J8410 Pulmonary fibrosis, unspecified: Secondary | ICD-10-CM | POA: Insufficient documentation

## 2014-12-15 DIAGNOSIS — R634 Abnormal weight loss: Secondary | ICD-10-CM | POA: Insufficient documentation

## 2014-12-15 DIAGNOSIS — R140 Abdominal distension (gaseous): Secondary | ICD-10-CM | POA: Insufficient documentation

## 2014-12-15 DIAGNOSIS — Z79899 Other long term (current) drug therapy: Secondary | ICD-10-CM | POA: Diagnosis not present

## 2014-12-15 DIAGNOSIS — R531 Weakness: Secondary | ICD-10-CM | POA: Insufficient documentation

## 2014-12-15 DIAGNOSIS — D474 Osteomyelofibrosis: Secondary | ICD-10-CM | POA: Insufficient documentation

## 2014-12-15 MED ORDER — TORSEMIDE 20 MG PO TABS: 40.0000 mg | ORAL_TABLET | Freq: Every day | ORAL | Status: DC

## 2014-12-16 ENCOUNTER — Other Ambulatory Visit: Payer: Self-pay | Admitting: Interventional Radiology

## 2014-12-17 ENCOUNTER — Other Ambulatory Visit: Payer: Self-pay | Admitting: Oncology

## 2014-12-17 ENCOUNTER — Ambulatory Visit
Admission: RE | Admit: 2014-12-17 | Discharge: 2014-12-17 | Disposition: A | Discharge status: Home / Self Care | Payer: Medicare Other | Source: Ambulatory Visit | Attending: Oncology | Admitting: Oncology

## 2014-12-17 DIAGNOSIS — R188 Other ascites: Secondary | ICD-10-CM | POA: Diagnosis present

## 2014-12-22 LAB — CYTOLOGY - NON PAP

## 2014-12-23 LAB — PATHOLOGY

## 2014-12-24 ENCOUNTER — Inpatient Hospital Stay (HOSPITAL_BASED_OUTPATIENT_CLINIC_OR_DEPARTMENT_OTHER): Payer: Medicare Other | Admitting: Oncology

## 2014-12-24 VITALS — BP 108/60 | HR 82 | Temp 95.1°F | Wt 160.7 lb

## 2014-12-24 DIAGNOSIS — D474 Osteomyelofibrosis: Secondary | ICD-10-CM

## 2014-12-24 DIAGNOSIS — R109 Unspecified abdominal pain: Secondary | ICD-10-CM

## 2014-12-24 DIAGNOSIS — I517 Cardiomegaly: Secondary | ICD-10-CM

## 2014-12-24 DIAGNOSIS — R630 Anorexia: Secondary | ICD-10-CM

## 2014-12-24 DIAGNOSIS — C801 Malignant (primary) neoplasm, unspecified: Principal | ICD-10-CM

## 2014-12-24 DIAGNOSIS — J8410 Pulmonary fibrosis, unspecified: Secondary | ICD-10-CM

## 2014-12-24 DIAGNOSIS — R634 Abnormal weight loss: Secondary | ICD-10-CM

## 2014-12-24 DIAGNOSIS — R140 Abdominal distension (gaseous): Secondary | ICD-10-CM | POA: Diagnosis not present

## 2014-12-24 DIAGNOSIS — R188 Other ascites: Secondary | ICD-10-CM | POA: Diagnosis not present

## 2014-12-24 DIAGNOSIS — C786 Secondary malignant neoplasm of retroperitoneum and peritoneum: Secondary | ICD-10-CM

## 2014-12-24 DIAGNOSIS — R5383 Other fatigue: Secondary | ICD-10-CM

## 2014-12-24 DIAGNOSIS — R531 Weakness: Secondary | ICD-10-CM

## 2014-12-24 DIAGNOSIS — I2510 Atherosclerotic heart disease of native coronary artery without angina pectoris: Secondary | ICD-10-CM

## 2014-12-24 DIAGNOSIS — Z79899 Other long term (current) drug therapy: Secondary | ICD-10-CM

## 2014-12-25 ENCOUNTER — Encounter
Admission: RE | Admit: 2014-12-25 | Discharge: 2014-12-25 | Disposition: A | Discharge status: Home / Self Care | Payer: Medicare Other | Source: Ambulatory Visit | Attending: Oncology | Admitting: Oncology

## 2014-12-25 ENCOUNTER — Encounter: Payer: Self-pay | Admitting: Oncology

## 2014-12-25 DIAGNOSIS — C786 Secondary malignant neoplasm of retroperitoneum and peritoneum: Secondary | ICD-10-CM | POA: Diagnosis present

## 2014-12-25 DIAGNOSIS — E079 Disorder of thyroid, unspecified: Secondary | ICD-10-CM | POA: Insufficient documentation

## 2014-12-25 DIAGNOSIS — R188 Other ascites: Secondary | ICD-10-CM | POA: Diagnosis present

## 2014-12-25 DIAGNOSIS — C801 Malignant (primary) neoplasm, unspecified: Secondary | ICD-10-CM | POA: Diagnosis present

## 2014-12-25 DIAGNOSIS — D474 Osteomyelofibrosis: Secondary | ICD-10-CM | POA: Insufficient documentation

## 2014-12-25 LAB — GLUCOSE, CAPILLARY: Glucose-Capillary: 101 mg/dL — ABNORMAL HIGH (ref 65–99)

## 2014-12-25 MED ORDER — FLUDEOXYGLUCOSE F - 18 (FDG) INJECTION
12.7500 | Freq: Once | INTRAVENOUS | Status: AC | PRN
  Administered 2014-12-25: 12.75 via INTRAVENOUS

## 2014-12-25 NOTE — Progress Notes (Signed)
[place] @ [facility] [phone]  Fax:([phone]) [place] OB: 09-29-1929  MR#: [id number]  OZD#:[id number]  Patient Care Team: Tracie Harrier, MD as PCP - General (Internal Medicine)  CHIEF COMPLAINT:  Chief Complaint  Patient presents with  . Follow-up      Megakaryocytic myelosclerosis   12/25/2014 Initial Diagnosis Megakaryocytic myelosclerosis    No flowsheet data found.  INTERVAL HISTORY: 81 year old gentleman with recurrent ascites.  Second attempt at abdominal paracentesis was negative for any malignant  Cells.. Patient continues to have abdominal discomfort, abdominal distention.  Losing weight.  Appetite is poor.  No rectal bleeding.  No vomiting.  No diarrhea.  REVIEW OF SYSTEMS:   Gen. status: Patient is feeling weak tired.  Declining performance status.  HEENT: No headache no dizziness no soreness in the mouth.  Lungs: No shortness of breath at rest.  Shortness of breath on exertion.  Cardiac: No palpitation.  GI: He recurrent abdominal distention with ascites.  Lower extremity edema.  Neurological system no headache no dizziness.  Skin: No rash.  Musculoskeletal system low back pain.  All other systems have been reviewed been reported to be negative  As per HPI. Otherwise, a complete review of systems is negatve.  PAST MEDICAL HISTORY: Past Medical History  Diagnosis Date  . Cancer     PAST SURGICAL HISTORY: No past surgical history on file.  FAMILY HISTORY No family history on file.  GYNECOLOGIC HISTORY:  No LMP for male patient.     ADVANCED DIRECTIVES:    HEALTH MAINTENANCE: History  Substance Use Topics  . Smoking status: Former Research scientist (life sciences)  . Smokeless tobacco: Not on file  . Alcohol Use: Not on file     Colonoscopy:  PAP:  Bone density:  Lipid panel:  No Known Allergies  Current Outpatient Prescriptions  Medication Sig Dispense Refill  . HYDROcodone-homatropine (HYCODAN) 5-1.5 MG/5ML syrup Take by mouth.    .  levothyroxine (SYNTHROID, LEVOTHROID) 88 MCG tablet Take by mouth.    . spironolactone (ALDACTONE) 50 MG tablet Take 50 mg by mouth daily.     No current facility-administered medications for this visit.    OBJECTIVE:  Filed Vitals:   12/24/14 1110  BP: 108/60  Pulse: 82  Temp: 95.1 F (35.1 C)     Body mass index is 22.43 kg/(m^2).    ECOG FS:2 - Symptomatic, <50% confined to bed  PHYSICAL EXAM: Gen. status: Alert and oriented individual not any acute distress.Head exam was generally normal. There was no scleral icterus or corneal arcus. Mucous membranes were moist..  Examination of the chest was unremarkable. There were no bony deformities, no asymmetry, and no other abnormalities.. Cardiac: Tachycardia.Cardiac exam revealed the PMI to be normally situated and sized. The rhythm was regular and no extrasystoles were noted during several minutes of auscultation. The first and second heart sounds were normal and physiologic splitting of the second heart sound was noted. There were no murmurs, rubs, clicks, or gallops.  Abdomen: Ascites.  Palpable spleen.  No other palpable masses.  Umbilical hernia.  Over extremity edema.Examination of the skin revealed no evidence of significant rashes, suspicious appearing nevi or other concerning lesions.Neurologically, the patient was awake, alert, and oriented to person, place and time. There were no obvious focal neurologic abnormalities.   LAB RESULTS:  No visits with results within 3 Day(s) from this visit. Latest known visit with results is:  Hospital Outpatient Visit on 12/17/2014  Component Date Value Ref Range Status  .  CYTOLOGY - NON GYN 12/17/2014    Final                   Value:Cytology - Non PAP CASE: [id number] PATIENT: Randy Stanton Non-Gyn Cytology Report     SPECIMEN SUBMITTED: A. peritoneal fluid, abdominal  CLINICAL HISTORY: None provided  PRE-OPERATIVE DIAGNOSIS: None Provided; None Provided  POST-OPERATIVE  DIAGNOSIS:      DIAGNOSIS: A. PERITONEAL FLUID; PARACENTESIS: - NEGATIVE FOR MALIGNANCY. - REACTIVE MESOTHELIAL CELLS WITH MIXED INFLAMMATION.  Thin Prep, cell block, and Wright stained slides reviewed.  GROSS DESCRIPTION:  A. Specimen Labeled: 2 containers labeled abdominal fluid Volume: approximately 1300 mL combined Description: cloudy yellow Cell block(s): 1 from each container equaling 2 cell blocks Final Diagnosis performed by Quay Burow, MD.  Electronically signed 12/22/2014 11:47:53AM    The electronic signature indicates that the named Attending Pathologist has evaluated the specimen  Technical component performed at [place], [address], [place], [place] [address] Lab: [phone] Dir: Ollen Barges                         am F. Evette Doffing, MD  Professional component performed at [facility], [facility], [place], [place], [place] [address] Lab: [phone] Dir: Dellia Nims. Rubinas, MD      No results found for: LABCA2 Lab Results  Component Value Date   CA199 7 11/25/2014   Lab Results  Component Value Date   CEA 0.9 11/25/2014   No results found for: PSA No results found for: CA125   STUDIES: Nm Pet Image Initial (pi) Skull Base To Thigh  12/25/2014   CLINICAL DATA:  Initial treatment strategy for peritoneal carcinomatosis. History of myelofibrosis.  EXAM: NUCLEAR MEDICINE PET SKULL BASE TO THIGH  TECHNIQUE: 12.8 mCi F-18 FDG was injected intravenously. Full-ring PET imaging was performed from the skull base to thigh after the radiotracer. CT data was obtained and used for attenuation correction and anatomic localization.  FASTING BLOOD GLUCOSE:  Value: 101 mg/dl  COMPARISON:  11/24/2014  FINDINGS: NECK  No areas of abnormal hypermetabolism.  CHEST  No areas of abnormal hypermetabolism.  ABDOMEN/PELVIS  splenic uptake is borderline to mildly increased (relatively equal to the liver). No focal hypermetabolism within the omentum/ peritoneum  to correspond to the areas of diffuse peritoneal thickening. No malignant range hypermetabolism within the ascites.  Soft tissue thickening in the gastric cardia on the prior CT is without hypermetabolic PET correlate. Similarly, the soft tissue thickening along the origin of the superior mesenteric artery is without hypermetabolic correlate. No primary malignancy source is identified.  SKELETON  No abnormal marrow activity.  CT IMAGES PERFORMED FOR ATTENUATION CORRECTION  Normal limited intracranial imaging. No cervical adenopathy. Left carotid atherosclerosis.  Multivessel coronary artery atherosclerosis. Mild cardiomegaly. Left lower lobe calcified granuloma.  Splenomegaly. Old granulomatous disease in the spleen. Cholelithiasis. Decrease in small volume abdominal pelvic ascites. Retroperitoneal soft tissue thickening, including surrounding the superior mesenteric artery. Low in density, possibly representing edema or fluid.  Interpolar right renal low-density lesion which is likely a cyst. Left greater than right renal cortical thinning. Omental/peritoneal thickening which is slightly improved. Mild prostatomegaly. Large right hydrocele.  Heterogeneous marrow density, likely related to the clinical history of myelofibrosis. Cervical spine fixation.  IMPRESSION: 1. No hypermetabolic correlate for the omental/peritoneal thickening or abdominal pelvic ascites. Based on CT appearance, this remains suspicious for peritoneal carcinomatosis. Alternate explanations, including portal venous hypertension (correlate with risk factors for mild cirrhosis),  or peritoneal infection should be considered. No primary malignancy site identified, including at the gastric cardia. 2. The retroperitoneal soft tissue fullness is again identified and is not hypermetabolic. Appears low in density, possibly relating to edema or small volume fluid. This warrants followup attention. 3. Splenomegaly with nonspecific borderline/mild  hypermetabolism. This may relate to the clinical history of myelofibrosis. 4.  Atherosclerosis, including within the coronary arteries.   Electronically Signed   By: Abigail Miyamoto M.D.   On: 12/25/2014 13:59   US Paracentesis  12/17/2014   CLINICAL DATA:  81 year old male with recurrent ascites of uncertain etiology.  EXAM: ULTRASOUND GUIDED DIAGNOSTIC AND THERAPEUTIC PARACENTESIS  COMPARISON:  Prior paracentesis 11/26/2014  PROCEDURE: An ultrasound guided paracentesis was thoroughly discussed with the patient and questions answered. The benefits, risks, alternatives and complications were also discussed. The patient understands and wishes to proceed with the procedure. Written consent was obtained.  Ultrasound was performed to localize and mark an adequate pocket of fluid in the right lower quadrant of the abdomen. The area was then prepped and draped in the normal sterile fashion. 1% Lidocaine was used for local anesthesia. Under ultrasound guidance a 6 French Safe-T-Centesis catheter catheter was introduced. Paracentesis was performed. The catheter was removed and a dressing applied.  COMPLICATIONS: None.  FINDINGS: A total of approximately 4.5 L of clear yellow ascitic fluid was removed. A fluid sample was sent for laboratory analysis.  IMPRESSION: Successful ultrasound guided paracentesis yielding 4.5 L of ascites.  Signed,  Criselda Peaches, MD  [facility]  [facility]   Electronically Signed   By: Jacqulynn Cadet M.D.   On: 12/17/2014 16:07   US Guided Paracentesis ([facility] Hx)  11/26/2014   CLINICAL DATA:  81 year old with abdominal ascites. Request for diagnostic and therapeutic paracentesis.  EXAM: ULTRASOUND GUIDED PARACENTESIS  COMPARISON:  None.  PROCEDURE: An ultrasound guided paracentesis was thoroughly discussed with the patient and questions answered. The benefits, risks, alternatives and complications were also discussed. The patient  understands and wishes to proceed with the procedure. Written consent was obtained.  Ultrasound was performed to localize and mark an adequate pocket of fluid in the right upper quadrant of the abdomen. The area was then prepped and draped in the normal sterile fashion. 1% Lidocaine was used for local anesthesia. Under ultrasound guidance a 19 gauge Yueh catheter was introduced. Paracentesis was performed. The catheter was removed and a dressing applied.  Complications: None.  FINDINGS: A total of approximately 3.35 L of yellow fluid was removed. A fluid sample was sent for laboratory analysis.  Estimated blood loss: Minimal  IMPRESSION: Successful ultrasound guided paracentesis yielding 3.35 L of ascites.   Electronically Signed   By: Markus Daft M.D.   On: 11/26/2014 09:55    ASSESSMENT: 81 year old gentleman with abnormal CT scan and history of myeloproliferative disease and myelofibrosis.  Recurrent ascites.  Gradually declining performance status  MEDICAL DECISION MAKING:  All lab data has been reviewed.  Pathology and cytology has been reviewed which were negative.  After prolonged discussion a PET scan has been scheduled and reviewed.  At present time there is no hypermetabolic area which can be biopsied.  Case will be discussed in tumor conference.  All tumor markers are normal.   Patient expressed understanding and was in agreement with this plan. He also understands that He can call clinic at any time with any questions, concerns, or complaints.    No matching staging information was found for  the patient.  Forest Gleason, MD   12/25/2014 4:16 PM

## 2014-12-29 ENCOUNTER — Encounter: Payer: Self-pay | Admitting: Oncology

## 2014-12-29 ENCOUNTER — Inpatient Hospital Stay (HOSPITAL_BASED_OUTPATIENT_CLINIC_OR_DEPARTMENT_OTHER): Payer: Medicare Other | Admitting: Oncology

## 2014-12-29 VITALS — BP 108/62 | HR 82 | Temp 95.5°F | Wt 160.3 lb

## 2014-12-29 DIAGNOSIS — R140 Abdominal distension (gaseous): Secondary | ICD-10-CM

## 2014-12-29 DIAGNOSIS — J8410 Pulmonary fibrosis, unspecified: Secondary | ICD-10-CM

## 2014-12-29 DIAGNOSIS — D474 Osteomyelofibrosis: Secondary | ICD-10-CM | POA: Diagnosis not present

## 2014-12-29 DIAGNOSIS — R188 Other ascites: Secondary | ICD-10-CM | POA: Diagnosis not present

## 2014-12-29 DIAGNOSIS — R5383 Other fatigue: Secondary | ICD-10-CM

## 2014-12-29 DIAGNOSIS — Z125 Encounter for screening for malignant neoplasm of prostate: Secondary | ICD-10-CM

## 2014-12-29 DIAGNOSIS — I2510 Atherosclerotic heart disease of native coronary artery without angina pectoris: Secondary | ICD-10-CM

## 2014-12-29 DIAGNOSIS — R109 Unspecified abdominal pain: Secondary | ICD-10-CM

## 2014-12-29 DIAGNOSIS — R634 Abnormal weight loss: Secondary | ICD-10-CM

## 2014-12-29 DIAGNOSIS — Z79899 Other long term (current) drug therapy: Secondary | ICD-10-CM

## 2014-12-29 DIAGNOSIS — R531 Weakness: Secondary | ICD-10-CM

## 2014-12-29 DIAGNOSIS — I517 Cardiomegaly: Secondary | ICD-10-CM

## 2014-12-29 DIAGNOSIS — R630 Anorexia: Secondary | ICD-10-CM

## 2014-12-29 DIAGNOSIS — C61 Malignant neoplasm of prostate: Secondary | ICD-10-CM

## 2014-12-29 LAB — CBC WITH DIFFERENTIAL/PLATELET
BASOS ABS: 0.6 10*3/uL — AB (ref 0.0–0.1)
BASOS PCT: 2 % — AB (ref 0–1)
Band Neutrophils: 18 % — ABNORMAL HIGH (ref 0–10)
Blasts: 0 %
Eosinophils Absolute: 0.9 10*3/uL — ABNORMAL HIGH (ref 0.0–0.7)
Eosinophils Relative: 3 % (ref 0–5)
HEMATOCRIT: 31.5 % — AB (ref 40.0–52.0)
Hemoglobin: 10 g/dL — ABNORMAL LOW (ref 13.0–18.0)
Lymphocytes Relative: 8 % — ABNORMAL LOW (ref 12–46)
Lymphs Abs: 2.5 10*3/uL (ref 0.7–4.0)
MCH: 23.9 pg — ABNORMAL LOW (ref 26.0–34.0)
MCHC: 31.7 g/dL — AB (ref 32.0–36.0)
MCV: 75.5 fL — ABNORMAL LOW (ref 80.0–100.0)
MONOS PCT: 5 % (ref 3–12)
Metamyelocytes Relative: 8 %
Monocytes Absolute: 1.6 10*3/uL — ABNORMAL HIGH (ref 0.1–1.0)
Myelocytes: 3 %
NRBC: 0 /100{WBCs}
Neutro Abs: 25.8 10*3/uL — ABNORMAL HIGH (ref 1.7–7.7)
Neutrophils Relative %: 52 % (ref 43–77)
Other: 0 %
Platelets: 132 10*3/uL — ABNORMAL LOW (ref 150–440)
Promyelocytes Absolute: 1 %
RBC: 4.17 MIL/uL — AB (ref 4.40–5.90)
RDW: 18.5 % — AB (ref 11.5–14.5)
Smear Review: DECREASED
WBC: 31.4 10*3/uL — ABNORMAL HIGH (ref 3.8–10.6)

## 2014-12-29 LAB — COMPREHENSIVE METABOLIC PANEL
ALBUMIN: 3.8 g/dL (ref 3.5–5.0)
ALT: 21 U/L (ref 17–63)
AST: 29 U/L (ref 15–41)
Alkaline Phosphatase: 118 U/L (ref 38–126)
Anion gap: 8 (ref 5–15)
BILIRUBIN TOTAL: 0.6 mg/dL (ref 0.3–1.2)
BUN: 37 mg/dL — AB (ref 6–20)
CALCIUM: 8.6 mg/dL — AB (ref 8.9–10.3)
CO2: 26 mmol/L (ref 22–32)
Chloride: 102 mmol/L (ref 101–111)
Creatinine, Ser: 1.62 mg/dL — ABNORMAL HIGH (ref 0.61–1.24)
GFR calc non Af Amer: 38 mL/min — ABNORMAL LOW (ref >60)
GFR, EST AFRICAN AMERICAN: 44 mL/min — AB (ref >60)
GLUCOSE: 110 mg/dL — AB (ref 65–99)
Potassium: 5.3 mmol/L — ABNORMAL HIGH (ref 3.5–5.1)
SODIUM: 136 mmol/L (ref 135–145)
Total Protein: 7 g/dL (ref 6.5–8.1)

## 2014-12-29 NOTE — Progress Notes (Signed)
If patient takes a deep breath or yawns he has discomfort in his chest.  Pt does have advanced directive.  Never smoked.

## 2014-12-30 LAB — PSA: PSA: 1.59 ng/mL (ref 0.00–4.00)

## 2015-01-03 ENCOUNTER — Encounter: Payer: Self-pay | Admitting: Oncology

## 2015-01-03 NOTE — Progress Notes (Signed)
[place] @ [facility] Telephone:(336) [phone]  Fax:(336) [place] OB: 21-Jul-1930  MR#: [id number]  MNO#:[id number]  Patient Care Team: Tracie Harrier, MD as PCP - General (Internal Medicine)  CHIEF COMPLAINT:  Chief Complaint  Patient presents with  . 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 recurrent 2006 on scalp.  Dr. Nehemiah Massed.  MOHS procedure by dr. Lacinda Axon, [facility] regional ventral hernia BCR ABL negative 04/07/08 JAK 2 V11f mutation negative 04/07/08 Inguinal hernias Elevated PSA in 2003. 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  INTERVAL HISTORY: 81 year old gentleman with recurrent ascites.  Second attempt at abdominal paracentesis was negative for any malignant  Cells.. Patient continues to have abdominal discomfort, abdominal distention.  Losing weight.  Appetite is poor.  No rectal bleeding.  No vomiting.  No diarrhea. Dec 29, 2014 Patient is here for further follow-up regarding PET scan.  He does not have any recurrent abdominal distention. Appetite still remains poor. No nausea or vomiting No rectal bleeding   REVIEW OF SYSTEMS:   Gen. status: Patient is feeling weak tired.  Declining performance status.  HEENT: No headache no dizziness no soreness in the mouth.  Lungs: No shortness of breath at rest.  Shortness of breath on exertion.  Cardiac: No palpitation.  GI: He recurrent abdominal distention with ascites.  Lower extremity edema.  Neurological system no headache no dizziness.  Skin: No rash.  Musculoskeletal system low back pain.  All other systems have been reviewed been reported to be negative  As per HPI. Otherwise,  a complete review of systems is negatve.  PAST MEDICAL HISTORY: Past Medical History  Diagnosis Date  . 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 Smoking status: Never Smoker   . Smokeless tobacco: Not on file  . Alcohol Use: Not on file       No Known Allergies  Current Outpatient Prescriptions  Medication Sig Dispense Refill  . levothyroxine (SYNTHROID, LEVOTHROID) 88 MCG tablet Take by mouth.    . spironolactone (ALDACTONE) 50 MG tablet Take 50 mg by mouth daily.    Marland Kitchen HYDROcodone-homatropine (HYCODAN) 5-1.5 MG/5ML syrup Take by mouth.     No current facility-administered medications for this visit.    OBJECTIVE:  Filed Vitals:   12/29/14 1054  BP: 108/62  Pulse: 82  Temp: 95.5 F (35.3 C)     Body mass index is 22.36 kg/(m^2).    ECOG FS:2 - Symptomatic, <50% confined to bed  PHYSICAL EXAM: Gen. status: Alert and oriented individual not any acute distress.Head exam was generally normal. There was  no scleral icterus or corneal arcus. Mucous membranes were moist..  Examination of the chest was unremarkable. There were no bony deformities, no asymmetry, and no other abnormalities.. Cardiac: Tachycardia.Cardiac exam revealed the PMI to be normally situated and sized. The rhythm was regular and no extrasystoles were noted during several minutes of auscultation. The first and second heart  sounds were normal and physiologic splitting of the second heart sound was noted. There were no murmurs, rubs, clicks, or gallops.  Abdomen: Ascites.  Palpable spleen.  No other palpable masses.  Umbilical hernia.  Over extremity edema.Examination of the skin revealed no evidence of significant rashes, suspicious appearing nevi or other concerning lesions.Neurologically, the patient was awake, alert, and oriented to person, place and time. There were no obvious focal neurologic abnormalities.   LAB RESULTS:  Office Visit on 12/29/2014  Component Date Value Ref Range Status  . Sodium 12/29/2014 136  135 - 145 mmol/L Final  . Potassium 12/29/2014 5.3* 3.5 - 5.1 mmol/L Final  . Chloride 12/29/2014 102  101 - 111 mmol/L Final  . CO2 12/29/2014 26  22 - 32 mmol/L Final  . Glucose, Bld 12/29/2014 110* 65 - 99 mg/dL Final  . BUN 12/29/2014 37* 6 - 20 mg/dL Final  . Creatinine, Ser 12/29/2014 1.62* 0.61 - 1.24 mg/dL Final  . Calcium 12/29/2014 8.6* 8.9 - 10.3 mg/dL Final  . Total Protein 12/29/2014 7.0  6.5 - 8.1 g/dL Final  . Albumin 12/29/2014 3.8  3.5 - 5.0 g/dL Final  . AST 12/29/2014 29  15 - 41 U/L Final  . ALT 12/29/2014 21  17 - 63 U/L Final  . Alkaline Phosphatase 12/29/2014 118  38 - 126 U/L Final  . Total Bilirubin 12/29/2014 0.6  0.3 - 1.2 mg/dL Final  . GFR calc non Af Amer 12/29/2014 38* >60 mL/min Final  . GFR calc Af Amer 12/29/2014 44* >60 mL/min Final   Comment: (NOTE) The eGFR has been calculated using the CKD EPI equation. This calculation has not been validated in all clinical situations. eGFR's persistently <60 mL/min signify possible Chronic Kidney Disease.   . Anion gap 12/29/2014 8  5 - 15 Final  . WBC 12/29/2014 31.4* 3.8 - 10.6 K/uL Final  . RBC 12/29/2014 4.17* 4.40 - 5.90 MIL/uL Final  . Hemoglobin 12/29/2014 10.0* 13.0 - 18.0 g/dL Final  . HCT 12/29/2014 31.5* 40.0 - 52.0 % Final  . MCV 12/29/2014 75.5* 80.0 - 100.0 fL Final  . MCH 12/29/2014 23.9* 26.0 - 34.0  pg Final  . MCHC 12/29/2014 31.7* 32.0 - 36.0 g/dL Final  . RDW 12/29/2014 18.5* 11.5 - 14.5 % Final  . Platelets 12/29/2014 132* 150 - 440 K/uL Final  . Neutrophils Relative % 12/29/2014 52  43 - 77 % Final  . Lymphocytes Relative 12/29/2014 8* 12 - 46 % Final  . Monocytes Relative 12/29/2014 5  3 - 12 % Final  . Eosinophils Relative 12/29/2014 3  0 - 5 % Final  . Basophils Relative 12/29/2014 2* 0 - 1 % Final  . Band Neutrophils 12/29/2014 18* 0 - 10 % Final  . Metamyelocytes Relative 12/29/2014 8   Final  . Myelocytes 12/29/2014 3   Final  . Promyelocytes Absolute 12/29/2014 1   Final  . Blasts 12/29/2014 0   Final  . nRBC 12/29/2014 0  0 /100 WBC Final  . Other 12/29/2014 0   Final  . Neutro Abs 12/29/2014 25.8* 1.7 - 7.7 K/uL Final  . Lymphs Abs  12/29/2014 2.5  0.7 - 4.0 K/uL Final  . Monocytes Absolute 12/29/2014 1.6* 0.1 - 1.0 K/uL Final  . Eosinophils Absolute 12/29/2014 0.9* 0.0 - 0.7 K/uL Final  . Basophils Absolute 12/29/2014 0.6* 0.0 - 0.1 K/uL Final  . RBC Morphology 12/29/2014 MARKED POIKILOCYTOSIS   Final   Comment: POLYCHROMASIA PRESENT TEARDROP CELLS   . Smear Review 12/29/2014 PLATELETS APPEAR DECREASED   Final  . PSA 12/29/2014 1.59  0.00 - 4.00 ng/mL Final   Comment: (NOTE) While PSA levels of <=4.0 ng/ml are reported as reference range, some men with levels below 4.0 ng/ml can have prostate cancer and many men with PSA above 4.0 ng/ml do not have prostate cancer.  Other tests such as free PSA, age specific reference ranges, PSA velocity and PSA doubling time may be helpful especially in men less than 37 years old. Performed at [facility]     No results found for: [facility] Lab Results  Component Value Date   CA199 7 11/25/2014   Lab Results  Component Value Date   CEA 0.9 11/25/2014   Lab Results  Component Value Date   PSA 1.59 12/29/2014    STUDIES: Nm Pet Image Initial (pi) Skull Base To Thigh  12/25/2014   CLINICAL DATA:  Initial  treatment strategy for peritoneal carcinomatosis. History of myelofibrosis.  EXAM: NUCLEAR MEDICINE PET SKULL BASE TO THIGH  TECHNIQUE: 12.8 mCi F-18 FDG was injected intravenously. Full-ring PET imaging was performed from the skull base to thigh after the radiotracer. CT data was obtained and used for attenuation correction and anatomic localization.  FASTING BLOOD GLUCOSE:  Value: 101 mg/dl  COMPARISON:  11/24/2014  FINDINGS: NECK  No areas of abnormal hypermetabolism.  CHEST  No areas of abnormal hypermetabolism.  ABDOMEN/PELVIS  splenic uptake is borderline to mildly increased (relatively equal to the liver). No focal hypermetabolism within the omentum/ peritoneum to correspond to the areas of diffuse peritoneal thickening. No malignant range hypermetabolism within the ascites.  Soft tissue thickening in the gastric cardia on the prior CT is without hypermetabolic PET correlate. Similarly, the soft tissue thickening along the origin of the superior mesenteric artery is without hypermetabolic correlate. No primary malignancy source is identified.  SKELETON  No abnormal marrow activity.  CT IMAGES PERFORMED FOR ATTENUATION CORRECTION  Normal limited intracranial imaging. No cervical adenopathy. Left carotid atherosclerosis.  Multivessel coronary artery atherosclerosis. Mild cardiomegaly. Left lower lobe calcified granuloma.  Splenomegaly. Old granulomatous disease in the spleen. Cholelithiasis. Decrease in small volume abdominal pelvic ascites. Retroperitoneal soft tissue thickening, including surrounding the superior mesenteric artery. Low in density, possibly representing edema or fluid.  Interpolar right renal low-density lesion which is likely a cyst. Left greater than right renal cortical thinning. Omental/peritoneal thickening which is slightly improved. Mild prostatomegaly. Large right hydrocele.  Heterogeneous marrow density, likely related to the clinical history of myelofibrosis. Cervical spine fixation.   IMPRESSION: 1. No hypermetabolic correlate for the omental/peritoneal thickening or abdominal pelvic ascites. Based on CT appearance, this remains suspicious for peritoneal carcinomatosis. Alternate explanations, including portal venous hypertension (correlate with risk factors for mild cirrhosis), or peritoneal infection should be considered. No primary malignancy site identified, including at the gastric cardia. 2. The retroperitoneal soft tissue fullness is again identified and is not hypermetabolic. Appears low in density, possibly relating to edema or small volume fluid. This warrants followup attention. 3. Splenomegaly with nonspecific borderline/mild hypermetabolism. This may relate to the clinical history of myelofibrosis. 4.  Atherosclerosis, including within the coronary  arteries.   Electronically Signed   By: Abigail Miyamoto M.D.   On: 12/25/2014 13:59   US Paracentesis  12/17/2014   CLINICAL DATA:  81 year old male with recurrent ascites of uncertain etiology.  EXAM: ULTRASOUND GUIDED DIAGNOSTIC AND THERAPEUTIC PARACENTESIS  COMPARISON:  Prior paracentesis 11/26/2014  PROCEDURE: An ultrasound guided paracentesis was thoroughly discussed with the patient and questions answered. The benefits, risks, alternatives and complications were also discussed. The patient understands and wishes to proceed with the procedure. Written consent was obtained.  Ultrasound was performed to localize and mark an adequate pocket of fluid in the right lower quadrant of the abdomen. The area was then prepped and draped in the normal sterile fashion. 1% Lidocaine was used for local anesthesia. Under ultrasound guidance a 6 French Safe-T-Centesis catheter catheter was introduced. Paracentesis was performed. The catheter was removed and a dressing applied.  COMPLICATIONS: None.  FINDINGS: A total of approximately 4.5 L of clear yellow ascitic fluid was removed. A fluid sample was sent for laboratory analysis.  IMPRESSION:  Successful ultrasound guided paracentesis yielding 4.5 L of ascites.  Signed,  Criselda Peaches, MD  [facility]  [facility]   Electronically Signed   By: Jacqulynn Cadet M.D.   On: 12/17/2014 16:07    ASSESSMENT: 81 year old gentleman with abnormal CT scan and history of myeloproliferative disease and myelofibrosis.  Recurrent ascites.  Gradually declining performance status PETscan has been reviewed independently  MEDICAL DECISION MAKING:   All the lab data has been reviewed.  Case was discussed at length in tumor conference.  It appears that entire change  visible on PET scan and CT scan may be secondary to cirrhosis of liver.  At this point and time no further evaluation has been planned.  We will wait and repeat another scan in 6 or 8 weeks.  If patient continues to collect fluid possibility of Pleurx catheter or other procedure can be planned for comfort measures   Patient expressed understanding and was in agreement with this plan. He also understands that He can call clinic at any time with any questions, concerns, or complaints.    No matching staging information was found for the patient.  Forest Gleason, MD   01/03/2015 5:14 PM

## 2015-01-03 NOTE — Progress Notes (Signed)
Progress Notes   Randy Stanton (MR# [id number])      Progress Notes Info    Author Note Status Last Update User Last Update Date/Time   Forest Gleason, MD Signed Forest Gleason, MD 12/25/2014 4:23 PM    Progress Notes    Expand All Collapse All    [place] @ [facility] Telephone:(336) [phone] Fax:(336) [place] OB: Jun 12, 1930 MR#: [id number]#:[id number]  Patient Care Team: Tracie Harrier, MD as PCP - General (Internal Medicine)  CHIEF COMPLAINT:  Chief Complaint  Patient presents with  . Follow-up      Megakaryocytic myelosclerosis   12/25/2014 Initial Diagnosis Megakaryocytic myelosclerosis    No flowsheet data found.  INTERVAL HISTORY: 81 year old gentleman with recurrent ascites. Second attempt at abdominal paracentesis was negative for any malignant Cells.. Patient continues to have abdominal discomfort, abdominal distention. Losing weight. Appetite is poor. No rectal bleeding. No vomiting. No diarrhea. Here for further follow-up regarding recurrent ascites  REVIEW OF SYSTEMS:  Gen. status: Patient is feeling weak tired. Declining performance status. HEENT: No headache no dizziness no soreness in the mouth. Lungs: No shortness of breath at rest. Shortness of breath on exertion. Cardiac: No palpitation. GI: He recurrent abdominal distention with ascites. Lower extremity edema. Neurological system no headache no dizziness. Skin: No rash. Musculoskeletal system low back pain. All other systems have been reviewed been reported to be negative  As per HPI. Otherwise, a complete review of systems is negatve.  PAST MEDICAL HISTORY: Past Medical History  Diagnosis Date  . Cancer     PAST SURGICAL HISTORY: No past surgical history on file.  FAMILY HISTORY No family history on file.  GYNECOLOGIC HISTORY: No LMP for male patient.    ADVANCED DIRECTIVES:    HEALTH MAINTENANCE: History    Substance Use Topics  . Smoking status: Former Research scientist (life sciences)  . Smokeless tobacco: Not on file  . Alcohol Use: Not on file    Colonoscopy: PAP: Bone density: Lipid panel:  No Known Allergies  Current Outpatient Prescriptions  Medication Sig Dispense Refill  . HYDROcodone-homatropine (HYCODAN) 5-1.5 MG/5ML syrup Take by mouth.    . levothyroxine (SYNTHROID, LEVOTHROID) 88 MCG tablet Take by mouth.    . spironolactone (ALDACTONE) 50 MG tablet Take 50 mg by mouth daily.     No current facility-administered medications for this visit.    OBJECTIVE:  Filed Vitals:   12/24/14 1110  BP: 108/60  Pulse: 82  Temp: 95.1 F (35.1 C)   Body mass index is 22.43 kg/(m^2). ECOG FS:2 - Symptomatic, <50% confined to bed  PHYSICAL EXAM: Gen. status: Alert and oriented individual not any acute distress.Head exam was generally normal. There was no scleral icterus or corneal arcus. Mucous membranes were moist.. Examination of the chest was unremarkable. There were no bony deformities, no asymmetry, and no other abnormalities.. Cardiac: Tachycardia.Cardiac exam revealed the PMI to be normally situated and sized. The rhythm was regular and no extrasystoles were noted during several minutes of auscultation. The first and second heart sounds were normal and physiologic splitting of the second heart sound was noted. There were no murmurs, rubs, clicks, or gallops. Abdomen: Ascites. Palpable spleen. No other palpable masses. Umbilical hernia. Over extremity edema.Examination of the skin revealed no evidence of significant rashes, suspicious appearing nevi or other concerning lesions.Neurologically, the patient was awake, alert, and oriented to person, place and time. There were no obvious focal neurologic abnormalities.   LAB RESULTS:  No visits with results  within 3 Day(s) from this visit. Latest known  visit with results is:  Hospital Outpatient Visit on 12/17/2014  Component Date Value Ref Range Status  . CYTOLOGY - NON GYN 12/17/2014   Final   Value:Cytology - Non PAP CASE: [id number] PATIENT: Randy Stanton Non-Gyn Cytology Report     SPECIMEN SUBMITTED: A. peritoneal fluid, abdominal  CLINICAL HISTORY: None provided  PRE-OPERATIVE DIAGNOSIS: None Provided; None Provided  POST-OPERATIVE DIAGNOSIS:      DIAGNOSIS: A. PERITONEAL FLUID; PARACENTESIS: - NEGATIVE FOR MALIGNANCY. - REACTIVE MESOTHELIAL CELLS WITH MIXED INFLAMMATION.  Thin Prep, cell block, and Wright stained slides reviewed.  GROSS DESCRIPTION:  A. Specimen Labeled: 2 containers labeled abdominal fluid Volume: approximately 1300 mL combined Description: cloudy yellow Cell block(s): 1 from each container equaling 2 cell blocks Final Diagnosis performed by Quay Burow, MD. Electronically signed 12/22/2014 11:47:53AM    The electronic signature indicates that the named Attending Pathologist has evaluated the specimen  Technical component performed at [facility], [address], [place], [place] [address] Lab: [phone] Dir: Ollen Barges   am F. Evette Doffing, MD  Professional component performed at [facility], [facility], [place], [place], [place] [address] Lab: [phone] Dir: Dellia Nims. Reuel Derby, MD       Recent Labs    No results found for: LABCA2    Recent Labs    Lab Results  Component Value Date   CA199 7 11/25/2014      Recent Labs    Lab Results  Component Value Date   CEA 0.9 11/25/2014      Recent Labs    No results found for: PSA    Recent Labs    No results found for: CA125     STUDIES:         ASSESSMENT: 81 year old gentleman with abnormal CT scan and history of myeloproliferative disease and myelofibrosis. Recurrent ascites. Gradually declining performance  status  MEDICAL DECISION MAKING:  Her all the lab data has been reviewed.  We will proceed with another abdominal paracentesis and psychological evaluation has suggested in tumor conference   Patient expressed understanding and was in agreement with this plan. He also understands that He can call clinic at any time with any questions, concerns, or complaints.    No matching staging information was found for the patient.  Forest Gleason, MD 12/25/2014 4:16 PM

## 2015-01-11 ENCOUNTER — Other Ambulatory Visit: Payer: Self-pay | Admitting: *Deleted

## 2015-01-11 ENCOUNTER — Telehealth: Payer: Self-pay | Admitting: *Deleted

## 2015-01-11 DIAGNOSIS — R188 Other ascites: Secondary | ICD-10-CM

## 2015-01-11 NOTE — Telephone Encounter (Signed)
Order placed for paracentesis and request sent to scheduling to schedule paracentesis this week except on Thursday.

## 2015-01-11 NOTE — Telephone Encounter (Signed)
I called radiology and was told that Dr Alexandria Lodge is not here until 6/15 and Dr Anselm Pancoast will be here on Thursday . I discussed with pt who said he cannot wait until the 15th and would be willing to try someone other than Dr Anselm Pancoast, "the sooner the better"

## 2015-01-14 ENCOUNTER — Other Ambulatory Visit: Payer: Self-pay | Admitting: Radiology

## 2015-01-15 ENCOUNTER — Telehealth: Payer: Self-pay | Admitting: *Deleted

## 2015-01-15 ENCOUNTER — Other Ambulatory Visit: Payer: Self-pay | Admitting: Oncology

## 2015-01-15 ENCOUNTER — Ambulatory Visit
Admission: RE | Admit: 2015-01-15 | Discharge: 2015-01-15 | Disposition: A | Discharge status: Home / Self Care | Payer: Medicare Other | Source: Ambulatory Visit | Attending: Oncology | Admitting: Oncology

## 2015-01-15 DIAGNOSIS — R188 Other ascites: Secondary | ICD-10-CM | POA: Insufficient documentation

## 2015-01-15 HISTORY — DX: Other ascites: R18.8

## 2015-01-15 LAB — BODY FLUID CELL COUNT WITH DIFFERENTIAL
Lymphs, Fluid: 27 %
Monocyte-Macrophage-Serous Fluid: 42 %
Neutrophil Count, Fluid: 31 %
Total Nucleated Cell Count, Fluid: 1404 cu mm

## 2015-01-15 MED ORDER — TORSEMIDE 20 MG PO TABS: 40.0000 mg | ORAL_TABLET | Freq: Every day | ORAL | Status: DC

## 2015-01-15 NOTE — OR Nursing (Signed)
Pt reports he is taking Torosemide but not sure of dose, Dr Oliva Bustard ordered it. Pt also reports he doesn't take the spiroaldactone or torosimide as ordered he only takes one every 3-4 days. Encouraged to take meds as ordered.

## 2015-01-15 NOTE — OR Nursing (Signed)
Left message with cancer center triage nurse line, regarding pt non compliance with diuretics and question about dosage of torosemide, and that pt stated he had only one left at home.

## 2015-01-15 NOTE — Telephone Encounter (Signed)
Escribed

## 2015-01-15 NOTE — Discharge Instructions (Signed)
Paracentesis °Paracentesis is a procedure used to remove excess fluid from the belly (abdomen). Excess fluid in the belly is called ascites. Excess fluid can be the result of certain conditions, such as infection, inflammation, abdominal injury, heart failure, chronic scarring of the liver (cirrhosis), or cancer. The excess fluid is removed using a needle inserted through the skin and tissue into the abdomen.  °A paracentesis may be done to: °· Determine the cause of the excess fluid through examination of the fluid. °· Relieve symptoms of shortness of breath or pain caused by the excess fluid. °· Determine presence of bleeding after an abdominal injury. °LET YOUR CAREGIVERS KNOW ABOUT: °· Allergies. °· Medications taken including herbs, eye drops, over-the-counter medications, and creams. °· Use of steroids (by mouth or creams). °· Previous problems with anesthetics or numbing medicine. °· Possibility of pregnancy, if this applies. °· History of blood clots (thrombophlebitis). °· History of bleeding or blood problems. °· Previous surgery. °· Other health problems. °RISKS AND COMPLICATIONS °· Injury to an abdominal organ, such as the bowel (large intestine), liver, spleen, or bladder. °· Possible infection. °· Bleeding. °· Low blood pressure (hypotension). °BEFORE THE PROCEDURE °This is a procedure that can be done as an outpatient. Confirm the time that you need to arrive for your procedure. A blood sample may be done to determine your blood clotting time. The presence of a severe bleeding disorder (coagulopathy) which cannot be promptly corrected may make this procedure inadvisable. You may be asked to urinate. °PROCEDURE °The procedure will take about 30 minutes. This time will vary depending on the amount of fluid that is removed. You may be asked to lie on your back with your head elevated. An area on your abdomen will be cleansed. A numbing medicine may then be injected (local anesthesia) into the skin and  tissue. A needle is inserted through your abdominal skin and tissues until it is positioned in your abdomen. You may feel pressure or slight pain as the needle is positioned into the abdomen. Fluid is removed from the abdomen through the needle. Tell your caregiver if you feel dizzy or lightheaded. The needle is withdrawn once the desired amount of fluid has been removed. A sample of the fluid may be sent for examination.  °AFTER THE PROCEDURE °Your recovery will be assessed and monitored. If there are no problems, as an outpatient, you should be able to go home shortly after the procedure. There may be a very limited amount of clear fluid draining from the needle insertion site over the next 2 days. Confirm with your caregiver as to the expected amount of drainage. °Obtaining the Test Results °It is your responsibility to obtain your test results. Do not assume everything is normal if you have not heard from your caregiver or the medical facility. It is important for you to follow up on all of your test results. °HOME CARE INSTRUCTIONS  °· You may resume normal diet and activities as directed or allowed. °· Only take over-the-counter or prescription medicines for pain, discomfort, or fever as directed by your caregiver. °SEEK IMMEDIATE MEDICAL CARE IF: °· You develop shortness of breath or chest pain. °· You develop increasing pain, discomfort, or swelling in your abdomen. °· You develop new drainage or pus coming from site where fluid was removed. °· You develop swelling or increased redness from site where fluid was removed. °· You develop an unexplained temperature of 102° F (38.9° C) or above. °Document Released: 02/06/2005 Document Revised: 10/16/2011 Document   Reviewed: 03/15/2009 °ExitCare® Patient Information ©2015 [employer]. This information is not intended to replace advice given to you by your health care provider. Make sure you discuss any questions you have with your health care provider. ° °

## 2015-01-19 ENCOUNTER — Other Ambulatory Visit: Payer: Self-pay | Admitting: Oncology

## 2015-01-19 ENCOUNTER — Telehealth: Payer: Self-pay | Admitting: *Deleted

## 2015-01-19 DIAGNOSIS — K652 Spontaneous bacterial peritonitis: Secondary | ICD-10-CM

## 2015-01-19 LAB — PATHOLOGIST SMEAR REVIEW

## 2015-01-19 MED ORDER — CIPROFLOXACIN HCL 500 MG PO TABS: 500.0000 mg | ORAL_TABLET | Freq: Every day | ORAL | Status: AC

## 2015-01-19 NOTE — Telephone Encounter (Signed)
Per Dr. Grayland Ormond, cipro 500mg  daily x 14 days called into pharmacy to help treat bacteria present in abdominal fluid from recent paracentesis. Informed patient of these findings and that needs to start taking antibiotics called into pharmacy. Informed pt will discuss with Dr. Oliva Bustard next week and will call pt back with further instructions. Pt verbalized understanding.

## 2015-01-20 LAB — CYTOLOGY - NON PAP

## 2015-01-21 ENCOUNTER — Other Ambulatory Visit: Payer: Self-pay | Admitting: *Deleted

## 2015-01-21 DIAGNOSIS — D474 Osteomyelofibrosis: Secondary | ICD-10-CM

## 2015-01-26 ENCOUNTER — Inpatient Hospital Stay: Payer: Medicare Other

## 2015-01-26 ENCOUNTER — Inpatient Hospital Stay: Payer: Medicare Other | Attending: Oncology | Admitting: Oncology

## 2015-01-26 VITALS — BP 115/61 | HR 86 | Temp 97.1°F | Wt 159.0 lb

## 2015-01-26 DIAGNOSIS — Z79899 Other long term (current) drug therapy: Secondary | ICD-10-CM | POA: Insufficient documentation

## 2015-01-26 DIAGNOSIS — R140 Abdominal distension (gaseous): Secondary | ICD-10-CM | POA: Insufficient documentation

## 2015-01-26 DIAGNOSIS — K219 Gastro-esophageal reflux disease without esophagitis: Secondary | ICD-10-CM

## 2015-01-26 DIAGNOSIS — R0602 Shortness of breath: Secondary | ICD-10-CM | POA: Diagnosis not present

## 2015-01-26 DIAGNOSIS — R188 Other ascites: Secondary | ICD-10-CM | POA: Insufficient documentation

## 2015-01-26 DIAGNOSIS — K439 Ventral hernia without obstruction or gangrene: Secondary | ICD-10-CM | POA: Diagnosis not present

## 2015-01-26 DIAGNOSIS — D474 Osteomyelofibrosis: Secondary | ICD-10-CM

## 2015-01-26 DIAGNOSIS — N400 Enlarged prostate without lower urinary tract symptoms: Secondary | ICD-10-CM

## 2015-01-26 DIAGNOSIS — R630 Anorexia: Secondary | ICD-10-CM | POA: Diagnosis not present

## 2015-01-26 DIAGNOSIS — R972 Elevated prostate specific antigen [PSA]: Secondary | ICD-10-CM | POA: Diagnosis not present

## 2015-01-26 DIAGNOSIS — R109 Unspecified abdominal pain: Secondary | ICD-10-CM | POA: Insufficient documentation

## 2015-01-26 DIAGNOSIS — Z85820 Personal history of malignant melanoma of skin: Secondary | ICD-10-CM

## 2015-01-26 DIAGNOSIS — R634 Abnormal weight loss: Secondary | ICD-10-CM | POA: Diagnosis not present

## 2015-01-26 LAB — CBC WITH DIFFERENTIAL/PLATELET
BASOS PCT: 1 %
Band Neutrophils: 13 % — ABNORMAL HIGH (ref 0–10)
Blasts: 0 %
EOS PCT: 1 %
HCT: 30.5 % — ABNORMAL LOW (ref 40.0–52.0)
Hemoglobin: 9.5 g/dL — ABNORMAL LOW (ref 13.0–18.0)
Lymphocytes Relative: 14 %
MCH: 23.5 pg — ABNORMAL LOW (ref 26.0–34.0)
MCHC: 31.2 g/dL — AB (ref 32.0–36.0)
MCV: 75.3 fL — ABNORMAL LOW (ref 80.0–100.0)
Metamyelocytes Relative: 3 %
Monocytes Relative: 7 %
Myelocytes: 9 %
NEUTROS PCT: 52 %
PLATELETS: 109 10*3/uL — AB (ref 150–440)
PROMYELOCYTES ABS: 0 %
RBC: 4.05 MIL/uL — ABNORMAL LOW (ref 4.40–5.90)
RDW: 19.3 % — ABNORMAL HIGH (ref 11.5–14.5)
WBC: 30.2 10*3/uL — ABNORMAL HIGH (ref 3.8–10.6)
nRBC: 3 /100 WBC — ABNORMAL HIGH

## 2015-01-26 LAB — COMPREHENSIVE METABOLIC PANEL
ALK PHOS: 126 U/L (ref 38–126)
ALT: 19 U/L (ref 17–63)
AST: 26 U/L (ref 15–41)
Albumin: 3.2 g/dL — ABNORMAL LOW (ref 3.5–5.0)
Anion gap: 9 (ref 5–15)
BUN: 36 mg/dL — ABNORMAL HIGH (ref 6–20)
CHLORIDE: 96 mmol/L — AB (ref 101–111)
CO2: 24 mmol/L (ref 22–32)
Calcium: 7.8 mg/dL — ABNORMAL LOW (ref 8.9–10.3)
Creatinine, Ser: 1.72 mg/dL — ABNORMAL HIGH (ref 0.61–1.24)
GFR calc Af Amer: 41 mL/min — ABNORMAL LOW (ref >60)
GFR calc non Af Amer: 35 mL/min — ABNORMAL LOW (ref >60)
Glucose, Bld: 155 mg/dL — ABNORMAL HIGH (ref 65–99)
Potassium: 4.3 mmol/L (ref 3.5–5.1)
SODIUM: 129 mmol/L — AB (ref 135–145)
TOTAL PROTEIN: 6.3 g/dL — AB (ref 6.5–8.1)
Total Bilirubin: 0.6 mg/dL (ref 0.3–1.2)

## 2015-01-26 NOTE — Progress Notes (Signed)
Patient does have living will. Never smoked. 

## 2015-01-31 ENCOUNTER — Other Ambulatory Visit: Payer: Self-pay | Admitting: Oncology

## 2015-02-01 LAB — PATHOLOGY

## 2015-02-05 ENCOUNTER — Encounter: Payer: Self-pay | Admitting: Oncology

## 2015-02-05 NOTE — Progress Notes (Signed)
[place] @ [facility] [phone]  Fax:([phone]) [place] OB: October 03, 1929  MR#: [id number]  ZYS#:[id number]  Patient Care Team: Tracie Harrier, MD as PCP - General (Internal Medicine)  CHIEF COMPLAINT:  Chief Complaint  Patient presents with  . 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 recurrent 2006 on scalp.  Dr. Nehemiah Massed.  MOHS procedure by dr. Lacinda Axon, [facility] regional ventral hernia BCR ABL negative 04/07/08 JAK 2 V73f mutation negative 04/07/08 Inguinal hernias Elevated PSA in 2003. 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  INTERVAL HISTORY: 81 year old gentleman with recurrent ascites.  Second attempt at abdominal paracentesis was negative for any malignant  Cells.. Patient continues to have abdominal discomfort, abdominal distention.  Losing weight.  Appetite is poor.  No rectal bleeding.  No vomiting.  No diarrhea. Dec 29, 2014 Patient is here for further follow-up regarding PET scan.  He does not have any recurrent abdominal distention. Appetite still remains poor. No nausea or vomiting No rectal bleeding  January 26, 2015 Patient is here for ongoing evaluation.  In the interim.  Patient had abdominal paracentesis and cytology was negative.  There was some question about leukocytosis infection and patient was started on antibiotics however all the cultures were negative. Patient is here for further follow-up and treatment consideration Was started on Demadex and Aldactone Appetite has been decreasing.  Patient is losing weight.  REVIEW OF SYSTEMS:   Gen. status: Declining performance status. Lungs: Increasing shortness of  breath on exertion.  HEENT: No soreness in the mouth.  No difficulty swallowing. Abdomen: Recurrent abdominal distention needing paracentesis.  No hematemesis.  No melena or rectal bleeding. Lower extremity swelling Neurological system no dizziness.  No headache. Skin: No rash. GU: No dysuria or hematuria  As per HPI. Otherwise, a complete review of systems is negatve.  PAST MEDICAL HISTORY: Past Medical History  Diagnosis Date  . Cancer   . 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 Smoking status: Never Smoker   . Smokeless tobacco: Not on file  . Alcohol Use: No       No Known Allergies  Current Outpatient Prescriptions  Medication Sig Dispense Refill  . ciprofloxacin (CIPRO) 500 MG tablet Take 1 tablet (500 mg total) by mouth daily with breakfast. 14 tablet 1  . HYDROcodone-homatropine (HYCODAN) 5-1.5 MG/5ML syrup Take by mouth.    . levothyroxine (SYNTHROID, LEVOTHROID) 88 MCG tablet Take by mouth.    . spironolactone (ALDACTONE) 50 MG tablet Take 50 mg by mouth daily.    Marland Kitchen torsemide (DEMADEX) 20 MG tablet TAKE  TWO TABLETS BY MOUTH ONCE DAILY 30 tablet 0   No current facility-administered medications for this visit.    OBJECTIVE:  Filed Vitals:   01/26/15 1139  BP: 115/61  Pulse: 86  Temp: 97.1 F (36.2 C)     Body mass index is 22.18 kg/(m^2).    ECOG FS:2 - Symptomatic,  <50% confined to bed  PHYSICAL EXAM: Gen. status: Alert and oriented individual not any acute distress.Head exam was generally normal. There was no scleral icterus or corneal arcus. Mucous membranes were moist..  Examination of the chest was unremarkable. There were no bony deformities, no asymmetry, and no other abnormalities.. Cardiac: Tachycardia.Cardiac exam revealed the PMI to be normally situated and sized. The rhythm was regular and no extrasystoles were noted during several minutes of auscultation. The first and second heart sounds were normal and physiologic splitting of the second heart sound was noted. There were no murmurs, rubs, clicks, or gallops.  Abdomen: Ascites.  Palpable spleen.  No other palpable masses.  Umbilical hernia.  Over extremity edema.Examination of the skin revealed no evidence of significant rashes, suspicious appearing nevi or other concerning lesions.Neurologically, the patient was awake, alert, and oriented to person, place and time. There were no obvious focal neurologic abnormalities.   LAB RESULTS:  Appointment on 01/26/2015  Component Date Value Ref Range Status  . WBC 01/26/2015 30.2* 3.8 - 10.6 K/uL Final  . RBC 01/26/2015 4.05* 4.40 - 5.90 MIL/uL Final  . Hemoglobin 01/26/2015 9.5* 13.0 - 18.0 g/dL Final  . HCT 01/26/2015 30.5* 40.0 - 52.0 % Final  . MCV 01/26/2015 75.3* 80.0 - 100.0 fL Final  . MCH 01/26/2015 23.5* 26.0 - 34.0 pg Final  . MCHC 01/26/2015 31.2* 32.0 - 36.0 g/dL Final  . RDW 01/26/2015 19.3* 11.5 - 14.5 % Final  . Platelets 01/26/2015 109* 150 - 440 K/uL Final  . Neutrophils Relative % 01/26/2015 52   Final  . Band Neutrophils 01/26/2015 13* 0 - 10 % Final  . Lymphocytes Relative 01/26/2015 14   Final  . Monocytes Relative 01/26/2015 7   Final  . Eosinophils Relative 01/26/2015 1   Final  . Basophils Relative 01/26/2015 1   Final  . RBC Morphology 01/26/2015 ELLIPTOCYTES   Final   Comment: TEARDROP CELLS POLYCHROMASIA  PRESENT HYPOCHROMIA   . Smear Review 01/26/2015 LARGE PLATELETS PRESENT   Final  . nRBC 01/26/2015 3* 0 /100 WBC Final  . Metamyelocytes Relative 01/26/2015 3   Final  . Myelocytes 01/26/2015 9   Final  . Promyelocytes Absolute 01/26/2015 0   Final  . Blasts 01/26/2015 0   Final  . Sodium 01/26/2015 129* 135 - 145 mmol/L Final  . Potassium 01/26/2015 4.3  3.5 - 5.1 mmol/L Final  . Chloride 01/26/2015 96* 101 - 111 mmol/L Final  . CO2 01/26/2015 24  22 - 32 mmol/L Final  . Glucose, Bld 01/26/2015 155* 65 - 99 mg/dL Final  . BUN 01/26/2015 36* 6 - 20 mg/dL Final  . Creatinine, Ser 01/26/2015 1.72* 0.61 - 1.24 mg/dL Final  . Calcium 01/26/2015 7.8* 8.9 - 10.3 mg/dL Final  . Total Protein 01/26/2015 6.3* 6.5 - 8.1 g/dL Final  . Albumin 01/26/2015 3.2* 3.5 - 5.0 g/dL Final  . AST 01/26/2015 26  15 - 41 U/L Final  . ALT 01/26/2015 19  17 - 63 U/L Final  . Alkaline Phosphatase 01/26/2015 126  38 - 126 U/L Final  . Total Bilirubin 01/26/2015 0.6  0.3 - 1.2 mg/dL  Final  . GFR calc non Af Amer 01/26/2015 35* >60 mL/min Final  . GFR calc Af Amer 01/26/2015 41* >60 mL/min Final   Comment: (NOTE) The eGFR has been calculated using the CKD EPI equation. This calculation has not been validated in all clinical situations. eGFR's persistently <60 mL/min signify possible Chronic Kidney Disease.   . Anion gap 01/26/2015 9  5 - 15 Final    No results found for: LABCA2 Lab Results  Component Value Date   CA199 7 11/25/2014   Lab Results  Component Value Date   CEA 0.9 11/25/2014   Lab Results  Component Value Date   PSA 1.59 12/29/2014    STUDIES: US Paracentesis  01/15/2015   CLINICAL DATA:  Ascites  EXAM: ULTRASOUND GUIDED PARACENTESIS  TECHNIQUE: Survey ultrasound of the abdomen was performed and an appropriate skin entry site in the right lower abdomen was selected. Skin site was marked, prepped with Betadine, and draped in usual sterile fashion, and infiltrated locally with 1%  lidocaine. A Safe-T-Centesis needle was advanced into the peritoneal space until fluid could be aspirated. The sheath was advanced and the needle removed. 6.4 L of clear yellow ascites were aspirated.  COMPLICATIONS: None  IMPRESSION: Technically successful ultrasound guided paracentesis, removing 6.4 L of ascites.   Electronically Signed   By: Marybelle Killings M.D.   On: 01/15/2015 15:47    ASSESSMENT: 81 year old gentleman with abnormal CT scan and history of myeloproliferative disease and myelofibrosis.  Recurrent ascites.  Gradually declining performance status PETscan has been reviewed independently  MEDICAL DECISION MAKING:   Recurrent ascites.  On June 10 patient had approximately 6 L of fluid removed. Psychological examination was negative Leukocytosis may be secondary to peripheral leukocytosis that was no evidence of infection patient did receive a short course of antibiotic Clinical picture is consistent with cirrhosis of liver. So far there is no evidence of malignancy This plan has been discussed with the patient.  Demadex and I will let on his controlling recurrent ascites if not then possibility of Pleurx catheter will be considered.  Patient expressed understanding and was in agreement with this plan. He also understands that He can call clinic at any time with any questions, concerns, or complaints.    No matching staging information was found for the patient.  Forest Gleason, MD   02/05/2015 4:19 PM

## 2015-02-17 ENCOUNTER — Ambulatory Visit: Payer: Medicare Other | Admitting: Oncology

## 2015-02-17 ENCOUNTER — Other Ambulatory Visit: Payer: Medicare Other

## 2015-02-22 ENCOUNTER — Encounter: Payer: Self-pay | Admitting: General Surgery

## 2015-02-22 ENCOUNTER — Telehealth: Payer: Self-pay | Admitting: *Deleted

## 2015-02-22 ENCOUNTER — Ambulatory Visit (INDEPENDENT_AMBULATORY_CARE_PROVIDER_SITE_OTHER): Admitting: General Surgery

## 2015-02-22 ENCOUNTER — Inpatient Hospital Stay: Attending: Oncology | Admitting: Oncology

## 2015-02-22 ENCOUNTER — Other Ambulatory Visit: Payer: Self-pay | Admitting: *Deleted

## 2015-02-22 ENCOUNTER — Ambulatory Visit: Payer: Medicare Other

## 2015-02-22 VITALS — BP 115/75 | HR 96 | Temp 97.3°F | Wt 173.7 lb

## 2015-02-22 VITALS — BP 118/60 | HR 88 | Resp 20 | Ht 71.0 in | Wt 173.6 lb

## 2015-02-22 DIAGNOSIS — D474 Osteomyelofibrosis: Secondary | ICD-10-CM

## 2015-02-22 DIAGNOSIS — R972 Elevated prostate specific antigen [PSA]: Secondary | ICD-10-CM | POA: Diagnosis not present

## 2015-02-22 DIAGNOSIS — R630 Anorexia: Secondary | ICD-10-CM | POA: Insufficient documentation

## 2015-02-22 DIAGNOSIS — R634 Abnormal weight loss: Secondary | ICD-10-CM | POA: Diagnosis not present

## 2015-02-22 DIAGNOSIS — Z79899 Other long term (current) drug therapy: Secondary | ICD-10-CM | POA: Diagnosis not present

## 2015-02-22 DIAGNOSIS — R188 Other ascites: Secondary | ICD-10-CM

## 2015-02-22 DIAGNOSIS — K439 Ventral hernia without obstruction or gangrene: Secondary | ICD-10-CM | POA: Insufficient documentation

## 2015-02-22 DIAGNOSIS — N400 Enlarged prostate without lower urinary tract symptoms: Secondary | ICD-10-CM | POA: Diagnosis not present

## 2015-02-22 DIAGNOSIS — K219 Gastro-esophageal reflux disease without esophagitis: Secondary | ICD-10-CM | POA: Diagnosis not present

## 2015-02-22 DIAGNOSIS — K7460 Unspecified cirrhosis of liver: Secondary | ICD-10-CM | POA: Insufficient documentation

## 2015-02-22 DIAGNOSIS — D649 Anemia, unspecified: Secondary | ICD-10-CM | POA: Diagnosis not present

## 2015-02-22 DIAGNOSIS — Z85820 Personal history of malignant melanoma of skin: Secondary | ICD-10-CM | POA: Insufficient documentation

## 2015-02-22 DIAGNOSIS — R0602 Shortness of breath: Secondary | ICD-10-CM | POA: Diagnosis not present

## 2015-02-22 DIAGNOSIS — D7581 Myelofibrosis: Secondary | ICD-10-CM | POA: Insufficient documentation

## 2015-02-22 DIAGNOSIS — K4020 Bilateral inguinal hernia, without obstruction or gangrene, not specified as recurrent: Secondary | ICD-10-CM | POA: Diagnosis not present

## 2015-02-22 DIAGNOSIS — D471 Chronic myeloproliferative disease: Secondary | ICD-10-CM

## 2015-02-22 LAB — CBC WITH DIFFERENTIAL/PLATELET
Band Neutrophils: 17 %
Basophils Absolute: 0 10*3/uL (ref 0–0.1)
Basophils Relative: 0 %
Eosinophils Absolute: 0.3 10*3/uL (ref 0–0.7)
Eosinophils Relative: 1 %
HEMATOCRIT: 29.5 % — AB (ref 40.0–52.0)
Hemoglobin: 9.3 g/dL — ABNORMAL LOW (ref 13.0–18.0)
LYMPHS ABS: 2.8 10*3/uL (ref 1.0–3.6)
Lymphocytes Relative: 10 %
MCH: 23.8 pg — ABNORMAL LOW (ref 26.0–34.0)
MCHC: 31.4 g/dL — ABNORMAL LOW (ref 32.0–36.0)
MCV: 76 fL — AB (ref 80.0–100.0)
METAMYELOCYTES PCT: 5 %
MONOS PCT: 8 %
Monocytes Absolute: 2.2 10*3/uL — ABNORMAL HIGH (ref 0.2–1.0)
Myelocytes: 14 %
NRBC: 7 /100{WBCs} — AB
Neutro Abs: 22.4 10*3/uL — ABNORMAL HIGH (ref 1.4–6.5)
Neutrophils Relative %: 45 %
Platelets: 124 10*3/uL — ABNORMAL LOW (ref 150–440)
RBC: 3.88 MIL/uL — AB (ref 4.40–5.90)
RDW: 19.2 % — ABNORMAL HIGH (ref 11.5–14.5)
WBC: 27.7 10*3/uL — AB (ref 3.8–10.6)

## 2015-02-22 LAB — COMPREHENSIVE METABOLIC PANEL
ALBUMIN: 3.4 g/dL — AB (ref 3.5–5.0)
ALT: 16 U/L — ABNORMAL LOW (ref 17–63)
AST: 24 U/L (ref 15–41)
Alkaline Phosphatase: 131 U/L — ABNORMAL HIGH (ref 38–126)
Anion gap: 9 (ref 5–15)
BILIRUBIN TOTAL: 0.8 mg/dL (ref 0.3–1.2)
BUN: 41 mg/dL — ABNORMAL HIGH (ref 6–20)
CO2: 25 mmol/L (ref 22–32)
Calcium: 8.1 mg/dL — ABNORMAL LOW (ref 8.9–10.3)
Chloride: 100 mmol/L — ABNORMAL LOW (ref 101–111)
Creatinine, Ser: 1.95 mg/dL — ABNORMAL HIGH (ref 0.61–1.24)
GFR calc Af Amer: 35 mL/min — ABNORMAL LOW (ref >60)
GFR calc non Af Amer: 30 mL/min — ABNORMAL LOW (ref >60)
Glucose, Bld: 147 mg/dL — ABNORMAL HIGH (ref 65–99)
POTASSIUM: 5.4 mmol/L — AB (ref 3.5–5.1)
Sodium: 134 mmol/L — ABNORMAL LOW (ref 135–145)
TOTAL PROTEIN: 6.8 g/dL (ref 6.5–8.1)

## 2015-02-22 LAB — APTT: aPTT: 43 s — ABNORMAL HIGH (ref 24–36)

## 2015-02-22 LAB — PROTIME-INR
INR: 1.17
Prothrombin Time: 15.1 seconds — ABNORMAL HIGH (ref 11.4–15.0)

## 2015-02-22 MED ORDER — FENTANYL 12 MCG/HR TD PT72: 12.5000 ug | MEDICATED_PATCH | TRANSDERMAL | Status: DC

## 2015-02-22 MED ORDER — DOCUSATE SODIUM 100 MG PO CAPS: 100.0000 mg | ORAL_CAPSULE | Freq: Two times a day (BID) | ORAL | Status: AC

## 2015-02-22 MED ORDER — POLYETHYLENE GLYCOL 3350 17 GM/SCOOP PO POWD: 17.0000 g | Freq: Every day | ORAL | Status: AC

## 2015-02-22 NOTE — Telephone Encounter (Signed)
I called pt back who said he could not come today as his wife would not be able to bring him in and that he did not need to see md he wanted to see the hospice nurse. I called Crystal back and she said to schedule him for the appt and she would see about getting him to the appt. Appt scheduled for 130 today

## 2015-02-22 NOTE — Progress Notes (Signed)
Patient does have living will.  Never smoked.  Acute add on today for increased fluid/pain in abdomen. Patient complains of not moving his bowels.

## 2015-02-22 NOTE — Progress Notes (Signed)
Patient ID: Randy Stanton, male   DOB: 08/19/1929, 81 y.o.   MRN: [id number]  Chief Complaint  Patient presents with  . Other    ascities    HPI [place] Mister Krahenbuhl is a 81 y.o. male with a history of myeloproliferative disease and myelofibrosis diagnosed 7 years ago. He has been having problems with recurrent ascites. He has an ultrasound guided paracentesis done on 01/15/15 and 6.4 liters was removed. He has had this problem for the past 4 months. He complains of constant abdominal pain and discomfort and has been having problems with constipation. He reports that he is going only twice a week.  HPI  Past Medical History  Diagnosis Date  . Ascites   . Liver cirrhosis   . Megakaryocytic myelosclerosis   . Melanoma     head    Past Surgical History  Procedure Laterality Date  . Spine surgery      neck  . Hernia repair      Family History  Problem Relation Age of Onset  . Lung cancer Brother     Social History History  Substance Use Topics  . Smoking status: Never Smoker   . Smokeless tobacco: Never Used  . Alcohol Use: No    No Known Allergies  Current Outpatient Prescriptions  Medication Sig Dispense Refill  . ciprofloxacin (CIPRO) 500 MG tablet Take 1 tablet (500 mg total) by mouth daily with breakfast. 14 tablet 1  . docusate sodium (COLACE) 100 MG capsule Take 1 capsule (100 mg total) by mouth 2 (two) times daily. HOSPICE PATIENT 30 capsule 3  . fentaNYL (DURAGESIC - DOSED MCG/HR) 12 MCG/HR Place 1 patch (12.5 mcg total) onto the skin every 3 (three) days. HOSPICE PATIENT 5 patch 0  . lactulose (CHRONULAC) 10 GM/15ML solution TAKE 15ML'S BY MOUTH TWICE A DAY AS NEEDED IF NO BOWEL MOVEMENT IN 24 HOURS. STOP IF HAVE BM  2  . levothyroxine (SYNTHROID, LEVOTHROID) 88 MCG tablet Take by mouth.    . polyethylene glycol powder (MIRALAX) powder Take 17 g by mouth daily. HOSPICE PATIENT 255 g 3  . spironolactone (ALDACTONE) 50 MG tablet Take 50 mg by mouth daily.    Marland Kitchen  torsemide (DEMADEX) 20 MG tablet TAKE TWO TABLETS BY MOUTH ONCE DAILY 30 tablet 0   No current facility-administered medications for this visit.    Review of Systems Review of Systems  Constitutional: Positive for fatigue. Negative for fever, chills, diaphoresis, activity change, appetite change and unexpected weight change.  Respiratory: Positive for shortness of breath and wheezing. Negative for apnea, cough, choking, chest tightness and stridor.   Cardiovascular: Negative.   Gastrointestinal: Positive for abdominal pain, constipation and abdominal distention. Negative for nausea, vomiting, diarrhea, blood in stool, anal bleeding and rectal pain.    Blood pressure 118/60, pulse 88, resp. rate 20, height 5\' 11"  (1.803 m), weight 173 lb 9.6 oz (78.744 kg).  Physical Exam Physical Exam  Constitutional: He is oriented to person, place, and time. He appears well-developed and well-nourished.  Ill looking, emaciated,   Eyes: Conjunctivae are normal. No scleral icterus.  Neck: Neck supple.  Cardiovascular: Normal rate, regular rhythm and normal heart sounds.   Pulmonary/Chest: Effort normal and breath sounds normal.  Abdominal: He exhibits shifting dullness, distension, fluid wave and ascites. A hernia is present. Hernia confirmed positive in the ventral area.    Neurological: He is alert and oriented to person, place, and time.  Skin: Skin is warm and dry.  Psychiatric:  He has a normal mood and affect.    Data Reviewed   Assessment    Signifcant ascites likely on the basis of cirrhosis. Previous evaluation have not revealed a source of malignancy. Patient is on hospice care. Feel it is reasonable to place a pleurx peritoneal catheter.     Plan    Discussed plan with patient and he is agreeable.   Patient's surgery has been scheduled for 02-23-15 at [facility].     Ref: Dr Oliva Bustard PCP:  Augusto Garbe 02/22/2015, 4:33 PM

## 2015-02-22 NOTE — Patient Instructions (Signed)
Patient's surgery has been scheduled for 02-23-15 at [facility].

## 2015-02-23 ENCOUNTER — Ambulatory Visit
Admission: RE | Admit: 2015-02-23 | Discharge: 2015-02-23 | Disposition: A | Discharge status: Home / Self Care | Source: Ambulatory Visit | Attending: General Surgery | Admitting: General Surgery

## 2015-02-23 ENCOUNTER — Ambulatory Visit: Admitting: Anesthesiology

## 2015-02-23 ENCOUNTER — Encounter
Admission: RE | Disposition: A | Discharge status: Home / Self Care | Payer: Self-pay | Source: Ambulatory Visit | Attending: General Surgery

## 2015-02-23 ENCOUNTER — Encounter: Payer: Self-pay | Admitting: *Deleted

## 2015-02-23 DIAGNOSIS — Z79899 Other long term (current) drug therapy: Secondary | ICD-10-CM | POA: Insufficient documentation

## 2015-02-23 DIAGNOSIS — Z801 Family history of malignant neoplasm of trachea, bronchus and lung: Secondary | ICD-10-CM | POA: Diagnosis not present

## 2015-02-23 DIAGNOSIS — Z85820 Personal history of malignant melanoma of skin: Secondary | ICD-10-CM | POA: Insufficient documentation

## 2015-02-23 DIAGNOSIS — K7460 Unspecified cirrhosis of liver: Secondary | ICD-10-CM | POA: Insufficient documentation

## 2015-02-23 DIAGNOSIS — Z515 Encounter for palliative care: Secondary | ICD-10-CM | POA: Insufficient documentation

## 2015-02-23 DIAGNOSIS — D7581 Myelofibrosis: Secondary | ICD-10-CM | POA: Diagnosis present

## 2015-02-23 DIAGNOSIS — R188 Other ascites: Secondary | ICD-10-CM | POA: Insufficient documentation

## 2015-02-23 DIAGNOSIS — R109 Unspecified abdominal pain: Secondary | ICD-10-CM | POA: Diagnosis not present

## 2015-02-23 DIAGNOSIS — D474 Osteomyelofibrosis: Secondary | ICD-10-CM | POA: Diagnosis not present

## 2015-02-23 DIAGNOSIS — K5900 Constipation, unspecified: Secondary | ICD-10-CM | POA: Diagnosis not present

## 2015-02-23 HISTORY — DX: Other specified postprocedural states: R11.2

## 2015-02-23 HISTORY — DX: Other specified postprocedural states: Z98.890

## 2015-02-23 HISTORY — PX: INSERTION PERITONEUM CATHETER: SHX6599

## 2015-02-23 SURGERY — INSERTION, CATHETER, PERITONEAL
Anesthesia: Monitor Anesthesia Care | Site: Abdomen | Wound class: Clean Contaminated

## 2015-02-23 MED ORDER — PROPOFOL INFUSION 10 MG/ML OPTIME
INTRAVENOUS | Status: DC | PRN
  Administered 2015-02-23: 25 ug/kg/min via INTRAVENOUS

## 2015-02-23 MED ORDER — LIDOCAINE HCL 1 % IJ SOLN
INTRAMUSCULAR | Status: DC | PRN
  Administered 2015-02-23: 10 mL

## 2015-02-23 MED ORDER — BACITRACIN-NEOMYCIN-POLYMYXIN 400-5-5000 EX OINT
TOPICAL_OINTMENT | CUTANEOUS | Status: AC
  Filled 2015-02-23: qty 1

## 2015-02-23 MED ORDER — FENTANYL CITRATE (PF) 100 MCG/2ML IJ SOLN: 25.0000 ug | INTRAMUSCULAR | Status: DC | PRN

## 2015-02-23 MED ORDER — FENTANYL CITRATE (PF) 100 MCG/2ML IJ SOLN
INTRAMUSCULAR | Status: DC | PRN
  Administered 2015-02-23: 25 ug via INTRAVENOUS

## 2015-02-23 MED ORDER — OXYCODONE HCL 5 MG PO TABS: 5.0000 mg | ORAL_TABLET | Freq: Once | ORAL | Status: DC | PRN

## 2015-02-23 MED ORDER — CEFAZOLIN SODIUM-DEXTROSE 2-3 GM-% IV SOLR
INTRAVENOUS | Status: AC
  Administered 2015-02-23: 2 g via INTRAVENOUS
  Filled 2015-02-23: qty 50

## 2015-02-23 MED ORDER — LACTATED RINGERS IV SOLN
INTRAVENOUS | Status: DC
  Administered 2015-02-23: 11:00:00 via INTRAVENOUS

## 2015-02-23 MED ORDER — CEFAZOLIN SODIUM-DEXTROSE 2-3 GM-% IV SOLR
2.0000 g | INTRAVENOUS | Status: AC
  Administered 2015-02-23: 2 g via INTRAVENOUS

## 2015-02-23 MED ORDER — OXYCODONE HCL 5 MG/5ML PO SOLN: 5.0000 mg | Freq: Once | ORAL | Status: DC | PRN

## 2015-02-23 SURGICAL SUPPLY — 15 items
CANISTER SUCT 1200ML W/VALVE (MISCELLANEOUS) ×9 IMPLANT
COVER PROBE FLX POLY STRL (MISCELLANEOUS) ×3 IMPLANT
DRAPE SHEET LG 3/4 BI-LAMINATE (DRAPES) ×3 IMPLANT
GLOVE BIO SURGEON STRL SZ7 (GLOVE) ×3 IMPLANT
GOWN STRL REUS W/ TWL LRG LVL3 (GOWN DISPOSABLE) ×2 IMPLANT
GOWN STRL REUS W/TWL LRG LVL3 (GOWN DISPOSABLE) ×6
KIT PLEURX DRAIN CATH 1000ML (MISCELLANEOUS) ×3 IMPLANT
KIT PLEURX PERI CATH 15.5FR (Catheter) ×3 IMPLANT
KIT RM TURNOVER STRD PROC AR (KITS) ×3 IMPLANT
LABEL OR SOLS (LABEL) ×3 IMPLANT
NS IRRIG 500ML POUR BTL (IV SOLUTION) ×3 IMPLANT
PLEUREX PERITONEAL CATHETER KIT ×2 IMPLANT
TOWEL OR 17X26 4PK STRL BLUE (TOWEL DISPOSABLE) ×3 IMPLANT
TUBING CONNECTING 10 (TUBING) ×2 IMPLANT
TUBING CONNECTING 10' (TUBING) ×1

## 2015-02-23 NOTE — OR Nursing (Signed)
Stated from or staff that 6 liters of fluid removed from peritoneum for ascites.

## 2015-02-23 NOTE — Anesthesia Preprocedure Evaluation (Addendum)
Anesthesia Evaluation  Patient identified by MRN, date of birth, ID band Patient awake    Reviewed: Allergy & Precautions, H&P , NPO status , Patient's Chart, lab work & pertinent test results, reviewed documented beta blocker date and time   Airway Mallampati: II  TM Distance: >3 FB Neck ROM: full    Dental  (+) Caps   Pulmonary neg pulmonary ROS,  breath sounds clear to auscultation  Pulmonary exam normal       Cardiovascular negative cardio ROS Normal cardiovascular examRhythm:regular Rate:Normal     Neuro/Psych negative neurological ROS  negative psych ROS   GI/Hepatic negative GI ROS, Neg liver ROS,   Endo/Other  negative endocrine ROS  Renal/GU negative Renal ROS  negative genitourinary   Musculoskeletal   Abdominal   Peds  Hematology negative hematology ROS (+)   Anesthesia Other Findings Past Medical History:   Ascites                                                      Liver cirrhosis                                              Megakaryocytic myelosclerosis                                Melanoma                                                       Comment:head  Patient on hospice care  Reproductive/Obstetrics negative OB ROS                           Anesthesia Physical Anesthesia Plan  ASA: IV  Anesthesia Plan: MAC   Post-op Pain Management:    Induction:   Airway Management Planned:   Additional Equipment:   Intra-op Plan:   Post-operative Plan:   Informed Consent: I have reviewed the patients History and Physical, chart, labs and discussed the procedure including the risks, benefits and alternatives for the proposed anesthesia with the patient or authorized representative who has indicated his/her understanding and acceptance.   Dental Advisory Given  Plan Discussed with: Anesthesiologist, CRNA and Surgeon  Anesthesia Plan Comments: (Will suspend DNR  during the procedure per patient wishses)       Anesthesia Quick Evaluation

## 2015-02-23 NOTE — Transfer of Care (Signed)
Immediate Anesthesia Transfer of Care Note  Patient: Randy Stanton  Procedure(s) Performed: Procedure(s): INSERTION PERITONEUM CATHETER (N/A)  Patient Location: PACU  Anesthesia Type:MAC  Level of Consciousness: awake, alert  and oriented  Airway & Oxygen Therapy: Patient Spontanous Breathing and Patient connected to nasal cannula oxygen  Post-op Assessment: Report given to RN and Post -op Vital signs reviewed and stable  Post vital signs: Reviewed and stable  Last Vitals:  Filed Vitals:   02/23/15 1624  BP: 99/56  Pulse: 83  Temp: 36.6 C  Resp: 17    Complications: No apparent anesthesia complications

## 2015-02-23 NOTE — H&P (View-Only) (Signed)
Patient ID: Randy Stanton, male   DOB: 02-23-1930, 81 y.o.   MRN: [id number]  Chief Complaint  Patient presents with  . Other    ascities    HPI [place] Randy Stanton is a 81 y.o. male with a history of myeloproliferative disease and myelofibrosis diagnosed 7 years ago. He has been having problems with recurrent ascites. He has an ultrasound guided paracentesis done on 01/15/15 and 6.4 liters was removed. He has had this problem for the past 4 months. He complains of constant abdominal pain and discomfort and has been having problems with constipation. He reports that he is going only twice a week.  HPI  Past Medical History  Diagnosis Date  . Ascites   . Liver cirrhosis   . Megakaryocytic myelosclerosis   . Melanoma     head    Past Surgical History  Procedure Laterality Date  . Spine surgery      neck  . Hernia repair      Family History  Problem Relation Age of Onset  . Lung cancer Brother     Social History History  Substance Use Topics  . Smoking status: Never Smoker   . Smokeless tobacco: Never Used  . Alcohol Use: No    No Known Allergies  Current Outpatient Prescriptions  Medication Sig Dispense Refill  . ciprofloxacin (CIPRO) 500 MG tablet Take 1 tablet (500 mg total) by mouth daily with breakfast. 14 tablet 1  . docusate sodium (COLACE) 100 MG capsule Take 1 capsule (100 mg total) by mouth 2 (two) times daily. HOSPICE PATIENT 30 capsule 3  . fentaNYL (DURAGESIC - DOSED MCG/HR) 12 MCG/HR Place 1 patch (12.5 mcg total) onto the skin every 3 (three) days. HOSPICE PATIENT 5 patch 0  . lactulose (CHRONULAC) 10 GM/15ML solution TAKE 15ML'S BY MOUTH TWICE A DAY AS NEEDED IF NO BOWEL MOVEMENT IN 24 HOURS. STOP IF HAVE BM  2  . levothyroxine (SYNTHROID, LEVOTHROID) 88 MCG tablet Take by mouth.    . polyethylene glycol powder (MIRALAX) powder Take 17 g by mouth daily. HOSPICE PATIENT 255 g 3  . spironolactone (ALDACTONE) 50 MG tablet Take 50 mg by mouth daily.    Marland Kitchen  torsemide (DEMADEX) 20 MG tablet TAKE TWO TABLETS BY MOUTH ONCE DAILY 30 tablet 0   No current facility-administered medications for this visit.    Review of Systems Review of Systems  Constitutional: Positive for fatigue. Negative for fever, chills, diaphoresis, activity change, appetite change and unexpected weight change.  Respiratory: Positive for shortness of breath and wheezing. Negative for apnea, cough, choking, chest tightness and stridor.   Cardiovascular: Negative.   Gastrointestinal: Positive for abdominal pain, constipation and abdominal distention. Negative for nausea, vomiting, diarrhea, blood in stool, anal bleeding and rectal pain.    Blood pressure 118/60, pulse 88, resp. rate 20, height 5\' 11"  (1.803 m), weight 173 lb 9.6 oz (78.744 kg).  Physical Exam Physical Exam  Constitutional: He is oriented to person, place, and time. He appears well-developed and well-nourished.  Ill looking, emaciated,   Eyes: Conjunctivae are normal. No scleral icterus.  Neck: Neck supple.  Cardiovascular: Normal rate, regular rhythm and normal heart sounds.   Pulmonary/Chest: Effort normal and breath sounds normal.  Abdominal: He exhibits shifting dullness, distension, fluid wave and ascites. A hernia is present. Hernia confirmed positive in the ventral area.    Neurological: He is alert and oriented to person, place, and time.  Skin: Skin is warm and dry.  Psychiatric:  He has a normal mood and affect.    Data Reviewed   Assessment    Signifcant ascites likely on the basis of cirrhosis. Previous evaluation have not revealed a source of malignancy. Patient is on hospice care. Feel it is reasonable to place a pleurx peritoneal catheter.     Plan    Discussed plan with patient and he is agreeable.   Patient's surgery has been scheduled for 02-23-15 at [facility].     Ref: Dr Oliva Bustard PCP:  Augusto Garbe 02/22/2015, 4:33 PM

## 2015-02-23 NOTE — Op Note (Signed)
Preop diagnosis massive ascites  Post op diagnosis: Same  Operation: Insertion of tunneled peritoneal catheter with ultrasound guidance  Surgeon: S.G.Sankar  Assistant:     Anesthesia: Monitored anesthesia care. 10 mL of 1% Xylocaine instilled for local anesthetic  Complications: None  EBL: Minimal  Drains: None  Description: Patient was placed supine in the operating table. Timeout was performed. The right side of the abdomen and the mid and lower part was prepped and draped out in sterile field. Ultrasound probe was brought up in the ascitic fluid was identified easily in the right lower quadrant. 2 skin incisions were mapped inferior and superior 5 cm apart. Local anesthetic was instilled the 2 incisions were made through the superior opening the peritoneal catheter was then tunneled through to the lower entrance site and the skin cough placed right up against the exit site. Using ultrasound and needle with an Angiocath were positioned in the peritoneal cavity successfully with the free withdrawal of of fluid. Seldinger technique was then utilized and subsequently a on cue the catheter was positioned going towards the right lower quadrant and the outer sheath removed. The lower incision was closed with a single 2-0 silk stitch. The exit site incision also snugged around the catheter with a single silk stitch. The peritoneal catheter was then connected to suction in slowly drained a total of 6 L with the significant reduction of his S asthmatic abdominal distention. The drainage was discontinued at 6 L's and there was marked improvement. Dry dressing was then placed with the is the to call within the dressing patient's then returned recovery room in stable condition

## 2015-02-23 NOTE — Anesthesia Postprocedure Evaluation (Signed)
  Anesthesia Post-op Note  Patient: Randy Stanton  Procedure(s) Performed: Procedure(s): INSERTION PERITONEUM CATHETER (N/A)  Anesthesia type:MAC  Patient location: PACU  Post pain: Pain level controlled  Post assessment: Post-op Vital signs reviewed, Patient's Cardiovascular Status Stable, Respiratory Function Stable, Patent Airway and No signs of Nausea or vomiting  Post vital signs: Reviewed and stable  Last Vitals:  Filed Vitals:   02/23/15 1750  BP: 105/51  Pulse: 81  Temp:   Resp: 15    Level of consciousness: awake, alert  and patient cooperative  Complications: No apparent anesthesia complications

## 2015-02-23 NOTE — Interval H&P Note (Signed)
History and Physical Interval Note:  02/23/2015 2:05 PM  Randy Stanton  has presented today for surgery, with the diagnosis of A  The various methods of treatment have been discussed with the patient and family. After consideration of risks, benefits and other options for treatment, the patient has consented to  Procedure(s): INSERTION PERITONEUM CATHETER (N/A) as a surgical intervention .  The patient's history has been reviewed, patient examined, no change in status, stable for surgery.  I have reviewed the patient's chart and labs.  Questions were answered to the patient's satisfaction.     [name] G

## 2015-02-24 ENCOUNTER — Encounter: Payer: Self-pay | Admitting: General Surgery

## 2015-02-26 ENCOUNTER — Ambulatory Visit: Admission: RE | Admit: 2015-02-26 | Payer: Medicare Other | Source: Ambulatory Visit

## 2015-02-28 ENCOUNTER — Encounter: Payer: Self-pay | Admitting: Oncology

## 2015-02-28 NOTE — Progress Notes (Signed)
Cancer Center @ [facility] Telephone:(336) [phone]  Fax:(336) [phone]     Randy Stanton OB: 09-18-1929  MR#: [id number]  DXP#:[id number]  Patient Care Team: Barbette Reichmann, MD as PCP - General (Internal Medicine) [name], MD (Oncology) [name], MD (General Surgery)  CHIEF COMPLAINT:  Chief Complaint  Patient presents with  . 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 recurrent 2006 on scalp.  Dr. Gwen Pounds.  MOHS procedure by dr. Adriana Simas, [facility] regional ventral hernia BCR ABL negative 04/07/08 JAK 2 V65f mutation negative 04/07/08 Inguinal hernias Elevated PSA in 2003. 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  INTERVAL HISTORY: 81 year old gentleman with recurrent ascites.  Second attempt at abdominal paracentesis was negative for any malignant  Cells.. Patient continues to have abdominal discomfort, abdominal distention.  Losing weight.  Appetite is poor.  No rectal bleeding.  No vomiting.  No diarrhea. Dec 29, 2014 Patient is here for further follow-up regarding PET scan.  He does not have any recurrent abdominal distention. Appetite still remains poor. No nausea or vomiting No rectal bleeding  January 26, 2015 Patient is here for ongoing evaluation.  In the interim.  Patient had abdominal paracentesis and cytology was negative.  There was some question about leukocytosis infection and patient was started on antibiotics however all the cultures were negative. Patient is here for further follow-up and treatment consideration Was started on Demadex and Aldactone Appetite has been decreasing.  Patient is losing weight. July, 2016 Received a  phone call from hospice that patient's condition has declined rapidly.  It has not increased abdominal distention.  Increasing shortness of breath.  Poor appetite.  Increasing abdominal discomfort.  Patient has stopped taking fluid pill.  Patient is in wheelchair  REVIEW OF SYSTEMS:   Gen. status: Patient's condition has declined rapidly.  In wheelchair. GI: Increasing abdominal distention.  Poor appetite.  Increasing abdominal discomfort. Not taken any fluid medication.  Poor appetite Constipation Neurological system no headache no dizziness musculoskeletal system increasing back pain and lungs: Increasing shortness of breath cardiac no chest pain Lower extremity intermittent edema Skin: No rash HEENT no difficulty swallowing   As per HPI. Otherwise, a complete review of systems is negatve.  PAST MEDICAL HISTORY: Past Medical History  Diagnosis Date  . Ascites   . Liver cirrhosis   . Megakaryocytic myelosclerosis   . Melanoma     head  . 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 Smoking status: Never Smoker   . Smokeless tobacco: Never Used  . Alcohol Use: No  No Known Allergies  Current Outpatient Prescriptions  Medication Sig Dispense Refill  . ciprofloxacin (CIPRO) 500 MG tablet Take 1 tablet (500 mg total) by mouth daily with breakfast. 14  tablet 1  . lactulose (CHRONULAC) 10 GM/15ML solution TAKE 15ML'S BY MOUTH TWICE A DAY AS NEEDED IF NO BOWEL MOVEMENT IN 24 HOURS. STOP IF HAVE BM  2  . levothyroxine (SYNTHROID, LEVOTHROID) 88 MCG tablet Take by mouth.    . spironolactone (ALDACTONE) 50 MG tablet Take 50 mg by mouth daily.    Marland Kitchen torsemide (DEMADEX) 20 MG tablet TAKE TWO TABLETS BY MOUTH ONCE DAILY 30 tablet 0  . docusate sodium (COLACE) 100 MG capsule Take 1 capsule (100 mg total) by mouth 2 (two) times daily. HOSPICE PATIENT 30 capsule 3  . fentaNYL (DURAGESIC - DOSED MCG/HR) 12 MCG/HR Place 1 patch (12.5 mcg total) onto the skin every 3 (three) days. HOSPICE PATIENT 5 patch 0  . furosemide (LASIX) 20 MG tablet Take 20 mg by mouth daily.  3  . LORazepam (ATIVAN) 0.5 MG tablet Take 0.5 mg by mouth every 6 (six) hours as needed. for anxiety  0  . polyethylene glycol powder (MIRALAX) powder Take 17 g by mouth daily. HOSPICE PATIENT (Patient not taking: Reported on 02/23/2015) 255 g 3   No current facility-administered medications for this visit.    OBJECTIVE:  Filed Vitals:   02/22/15 1343  BP: 115/75  Pulse: 96  Temp: 97.3 F (36.3 C)     Body mass index is 24.24 kg/(m^2).    ECOG FS:2 - Symptomatic, <50% confined to bed  PHYSICAL EXAM: Gen. status: Alert and oriented individual not any acute distress.Head exam was generally normal. There was no scleral icterus or corneal arcus. Mucous membranes were moist..  Examination of the chest was unremarkable. There were no bony deformities, no asymmetry, and no other abnormalities.. Cardiac: Tachycardia.Cardiac exam revealed the PMI to be normally situated and sized. The rhythm was regular and no extrasystoles were noted during several minutes of auscultation. The first and second heart sounds were normal and physiologic splitting of the second heart sound was noted. There were no murmurs, rubs, clicks, or gallops.  Abdomen: Ascites.  Palpable spleen.  No other palpable masses.   Umbilical hernia.  Over extremity edema.Examination of the skin revealed no evidence of significant rashes, suspicious appearing nevi or other concerning lesions.Neurologically, the patient was awake, alert, and oriented to person, place and time. There were no obvious focal neurologic abnormalities.   LAB RESULTS:  Appointment on 02/22/2015  Component Date Value Ref Range Status  . WBC 02/22/2015 27.7* 3.8 - 10.6 K/uL Final  . RBC 02/22/2015 3.88* 4.40 - 5.90 MIL/uL Final  . Hemoglobin 02/22/2015 9.3* 13.0 - 18.0 g/dL Final  . HCT 02/22/2015 29.5* 40.0 - 52.0 % Final  . MCV 02/22/2015 76.0* 80.0 - 100.0 fL Final  . MCH 02/22/2015 23.8* 26.0 - 34.0 pg Final  . MCHC 02/22/2015 31.4* 32.0 - 36.0 g/dL Final  . RDW 02/22/2015 19.2* 11.5 - 14.5 % Final  . Platelets 02/22/2015 124* 150 - 440 K/uL Final  . Neutrophils Relative % 02/22/2015 45   Final  . Lymphocytes Relative 02/22/2015 10   Final  . Monocytes Relative 02/22/2015 8   Final  . Eosinophils Relative 02/22/2015 1   Final  . Basophils Relative 02/22/2015 0   Final  . Band Neutrophils 02/22/2015 17   Final  . Metamyelocytes Relative 02/22/2015 5   Final  . Myelocytes 02/22/2015  14   Final  . nRBC 02/22/2015 7* 0 /100 WBC Final  . Neutro Abs 02/22/2015 22.4* 1.4 - 6.5 K/uL Final  . Lymphs Abs 02/22/2015 2.8  1.0 - 3.6 K/uL Final  . Monocytes Absolute 02/22/2015 2.2* 0.2 - 1.0 K/uL Final  . Eosinophils Absolute 02/22/2015 0.3  0 - 0.7 K/uL Final  . Basophils Absolute 02/22/2015 0.0  0 - 0.1 K/uL Final  . RBC Morphology 02/22/2015 POLYCHROMASIA PRESENT   Final   Comment: TEARDROP CELLS PLATELETS APPEAR DECREASED   . Sodium 02/22/2015 134* 135 - 145 mmol/L Final  . Potassium 02/22/2015 5.4* 3.5 - 5.1 mmol/L Final  . Chloride 02/22/2015 100* 101 - 111 mmol/L Final  . CO2 02/22/2015 25  22 - 32 mmol/L Final  . Glucose, Bld 02/22/2015 147* 65 - 99 mg/dL Final  . BUN 02/22/2015 41* 6 - 20 mg/dL Final  . Creatinine, Ser 02/22/2015  1.95* 0.61 - 1.24 mg/dL Final  . Calcium 02/22/2015 8.1* 8.9 - 10.3 mg/dL Final  . Total Protein 02/22/2015 6.8  6.5 - 8.1 g/dL Final  . Albumin 02/22/2015 3.4* 3.5 - 5.0 g/dL Final  . AST 02/22/2015 24  15 - 41 U/L Final  . ALT 02/22/2015 16* 17 - 63 U/L Final  . Alkaline Phosphatase 02/22/2015 131* 38 - 126 U/L Final  . Total Bilirubin 02/22/2015 0.8  0.3 - 1.2 mg/dL Final  . GFR calc non Af Amer 02/22/2015 30* >60 mL/min Final  . GFR calc Af Amer 02/22/2015 35* >60 mL/min Final   Comment: (NOTE) The eGFR has been calculated using the CKD EPI equation. This calculation has not been validated in all clinical situations. eGFR's persistently <60 mL/min signify possible Chronic Kidney Disease.   . Anion gap 02/22/2015 9  5 - 15 Final  . Prothrombin Time 02/22/2015 15.1* 11.4 - 15.0 seconds Final  . INR 02/22/2015 1.17   Final  . aPTT 02/22/2015 43* 24 - 36 seconds Final   Comment:        IF BASELINE aPTT IS ELEVATED, SUGGEST PATIENT RISK ASSESSMENT BE USED TO DETERMINE APPROPRIATE ANTICOAGULANT THERAPY.     No results found for: LABCA2 Lab Results  Component Value Date   CA199 7 11/25/2014   Lab Results  Component Value Date   CEA 0.9 11/25/2014   Lab Results  Component Value Date   PSA 1.59 12/29/2014    STUDIES: No results found.  ASSESSMENT: 81 year old gentleman with ascites most likely due to cirrhosis of liver however underlying malignancy cannot be ruled out. Myeloproliferative disease Patient is under hospice care  MEDICAL DECISION MAKING:  Patient has  progressive ascites did not take any fluid medication  I had a long discussion with the patient.  In order to get symptomatically patient may need abdominal paracentesis on a regular basis.  After explaining procedure with Pleurx catheter I called Dr. Jamal Collin and discuss situation with him for possibly putting Pleurx catheter Patient to be seen by hematology next morning. Patient is agreeable to this approach  he does not want any aggressive therapy or any abdominal exploration. Not interested in any further chemotherapy if found to have malignancy  Under hospice care recalled hospice nurses and give them order for regular 500 cc to 1 L of fluid removed twice a week to give comfort to the patient Total duration of visit was 45 minutes.  50% or more time was spent in counseling patient and family regarding prognosis and options of treatment and available resources Patient  will be followed on a regular basis under hospice care  Patient expressed understanding and was in agreement with this plan. He also understands that He can call clinic at any time with any questions, concerns, or complaints.    No matching staging information was found for the patient.  Forest Gleason, MD   02/28/2015 9:08 AM

## 2015-03-02 ENCOUNTER — Inpatient Hospital Stay

## 2015-03-02 ENCOUNTER — Inpatient Hospital Stay: Admitting: Oncology

## 2015-03-03 ENCOUNTER — Telehealth: Payer: Self-pay | Admitting: *Deleted

## 2015-03-03 DIAGNOSIS — D474 Osteomyelofibrosis: Secondary | ICD-10-CM

## 2015-03-03 MED ORDER — MORPHINE SULFATE (CONCENTRATE) 20 MG/ML PO SOLN: ORAL | Status: DC

## 2015-03-03 MED ORDER — FENTANYL 12 MCG/HR TD PT72: 12.5000 ug | MEDICATED_PATCH | TRANSDERMAL | Status: DC

## 2015-03-03 NOTE — Telephone Encounter (Signed)
faxed

## 2015-03-09 ENCOUNTER — Inpatient Hospital Stay (HOSPITAL_BASED_OUTPATIENT_CLINIC_OR_DEPARTMENT_OTHER): Admitting: Oncology

## 2015-03-09 ENCOUNTER — Encounter: Payer: Self-pay | Admitting: Oncology

## 2015-03-09 ENCOUNTER — Inpatient Hospital Stay: Attending: Oncology

## 2015-03-09 VITALS — BP 97/61 | HR 89 | Temp 96.9°F | Wt 168.2 lb

## 2015-03-09 DIAGNOSIS — K7460 Unspecified cirrhosis of liver: Secondary | ICD-10-CM | POA: Insufficient documentation

## 2015-03-09 DIAGNOSIS — R630 Anorexia: Secondary | ICD-10-CM | POA: Diagnosis not present

## 2015-03-09 DIAGNOSIS — R161 Splenomegaly, not elsewhere classified: Secondary | ICD-10-CM | POA: Diagnosis not present

## 2015-03-09 DIAGNOSIS — Z79899 Other long term (current) drug therapy: Secondary | ICD-10-CM | POA: Insufficient documentation

## 2015-03-09 DIAGNOSIS — R972 Elevated prostate specific antigen [PSA]: Secondary | ICD-10-CM | POA: Insufficient documentation

## 2015-03-09 DIAGNOSIS — R188 Other ascites: Secondary | ICD-10-CM | POA: Insufficient documentation

## 2015-03-09 DIAGNOSIS — D7581 Myelofibrosis: Secondary | ICD-10-CM | POA: Insufficient documentation

## 2015-03-09 DIAGNOSIS — R140 Abdominal distension (gaseous): Secondary | ICD-10-CM | POA: Insufficient documentation

## 2015-03-09 DIAGNOSIS — R634 Abnormal weight loss: Secondary | ICD-10-CM

## 2015-03-09 DIAGNOSIS — R270 Ataxia, unspecified: Secondary | ICD-10-CM | POA: Insufficient documentation

## 2015-03-09 DIAGNOSIS — K4090 Unilateral inguinal hernia, without obstruction or gangrene, not specified as recurrent: Secondary | ICD-10-CM | POA: Diagnosis not present

## 2015-03-09 DIAGNOSIS — K219 Gastro-esophageal reflux disease without esophagitis: Secondary | ICD-10-CM | POA: Insufficient documentation

## 2015-03-09 DIAGNOSIS — K437 Other and unspecified ventral hernia with gangrene: Secondary | ICD-10-CM | POA: Insufficient documentation

## 2015-03-09 DIAGNOSIS — D474 Osteomyelofibrosis: Secondary | ICD-10-CM

## 2015-03-09 DIAGNOSIS — Z85820 Personal history of malignant melanoma of skin: Secondary | ICD-10-CM | POA: Insufficient documentation

## 2015-03-09 DIAGNOSIS — L8990 Pressure ulcer of unspecified site, unspecified stage: Secondary | ICD-10-CM | POA: Diagnosis not present

## 2015-03-09 DIAGNOSIS — L8992 Pressure ulcer of unspecified site, stage 2: Secondary | ICD-10-CM | POA: Diagnosis not present

## 2015-03-09 DIAGNOSIS — R000 Tachycardia, unspecified: Secondary | ICD-10-CM

## 2015-03-09 LAB — CBC WITH DIFFERENTIAL/PLATELET
Band Neutrophils: 12 %
Basophils Absolute: 0.3 10*3/uL — ABNORMAL HIGH (ref 0–0.1)
Basophils Relative: 1 %
Blasts: 1 %
EOS ABS: 0 10*3/uL (ref 0–0.7)
EOS PCT: 0 %
HEMATOCRIT: 30.3 % — AB (ref 40.0–52.0)
Hemoglobin: 9.7 g/dL — ABNORMAL LOW (ref 13.0–18.0)
Lymphocytes Relative: 8 %
Lymphs Abs: 2.3 10*3/uL (ref 1.0–3.6)
MCH: 23.9 pg — ABNORMAL LOW (ref 26.0–34.0)
MCHC: 32.1 g/dL (ref 32.0–36.0)
MCV: 74.5 fL — AB (ref 80.0–100.0)
MONO ABS: 2.3 10*3/uL — AB (ref 0.2–1.0)
MONOS PCT: 8 %
MYELOCYTES: 12 %
Metamyelocytes Relative: 7 %
NRBC: 6 /100{WBCs} — AB
Neutro Abs: 24 10*3/uL — ABNORMAL HIGH (ref 1.4–6.5)
Neutrophils Relative %: 48 %
Platelets: 80 10*3/uL — ABNORMAL LOW (ref 150–440)
Promyelocytes Absolute: 3 %
RBC: 4.07 MIL/uL — AB (ref 4.40–5.90)
RDW: 19.3 % — AB (ref 11.5–14.5)
WBC: 29.3 10*3/uL — AB (ref 3.8–10.6)

## 2015-03-09 LAB — COMPREHENSIVE METABOLIC PANEL
ALK PHOS: 113 U/L (ref 38–126)
ALT: 14 U/L — ABNORMAL LOW (ref 17–63)
AST: 25 U/L (ref 15–41)
Albumin: 3.2 g/dL — ABNORMAL LOW (ref 3.5–5.0)
Anion gap: 10 (ref 5–15)
BILIRUBIN TOTAL: 0.9 mg/dL (ref 0.3–1.2)
BUN: 56 mg/dL — ABNORMAL HIGH (ref 6–20)
CALCIUM: 7.8 mg/dL — AB (ref 8.9–10.3)
CO2: 24 mmol/L (ref 22–32)
CREATININE: 2.41 mg/dL — AB (ref 0.61–1.24)
Chloride: 97 mmol/L — ABNORMAL LOW (ref 101–111)
GFR calc Af Amer: 27 mL/min — ABNORMAL LOW (ref >60)
GFR, EST NON AFRICAN AMERICAN: 23 mL/min — AB (ref >60)
GLUCOSE: 131 mg/dL — AB (ref 65–99)
Potassium: 4.4 mmol/L (ref 3.5–5.1)
Sodium: 131 mmol/L — ABNORMAL LOW (ref 135–145)
TOTAL PROTEIN: 6.6 g/dL (ref 6.5–8.1)

## 2015-03-09 NOTE — Progress Notes (Signed)
Patient does have living will. Never smoked. 

## 2015-03-09 NOTE — Progress Notes (Signed)
Randy Stanton @ [facility] [phone]  Fax:(336) [place] OB: 1930/07/16  MR#: [id number]  MVV#:[id number]  Patient Care Team: Tracie Harrier, MD as PCP - General (Internal Medicine) Forest Gleason, MD (Oncology) [name], MD (General Surgery)  CHIEF COMPLAINT:  Chief Complaint  Patient presents with  . 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 recurrent 2006 on scalp.  Dr. Nehemiah Massed.  MOHS procedure by dr. Lacinda Axon, [facility] regional ventral hernia BCR ABL negative 04/07/08 JAK 2 V86f mutation negative 04/07/08 Inguinal hernias Elevated PSA in 2003. 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  INTERVAL HISTORY: 81 year old gentleman with recurrent ascites.  Second attempt at abdominal paracentesis was negative for any malignant  Cells.. Patient continues to have abdominal discomfort, abdominal distention.  Losing weight.  Appetite is poor.  No rectal bleeding.  No vomiting.  No diarrhea. Patient is here for further evaluation.  Declining performance status.  Patient had only one abdominal paracentesis is 9 days ago. Had the Pleurx catheter placed in.  Patient is having increasing abdominal distention Also has developed decubitus ulcer which is more painful abdominal pain has improved with combination of fentanyl patch and morphine Under hospice care  REVIEW OF SYSTEMS:   Gen. status: Patient's condition has declined rapidly.  In wheelchair. GI: Increasing abdominal distention.  Poor appetite.  Increasing abdominal discomfort. Not taken any fluid medication.  Poor appetite Constipation Neurological system no headache no  dizziness musculoskeletal system increasing back pain and lungs: Increasing shortness of breath cardiac no chest pain Lower extremity intermittent edema Skin: Develop decubitus ulcer  HEENT no difficulty swallowing GU: No dysuria hematuria    As per HPI. Otherwise, a complete review of systems is negatve.  PAST MEDICAL HISTORY: Past Medical History  Diagnosis Date  . Ascites   . Liver cirrhosis   . Megakaryocytic myelosclerosis   . Melanoma     head  . 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 Smoking status: Never Smoker   . Smokeless tobacco: Never Used  . Alcohol Use: No       No Known Allergies  Current Outpatient Prescriptions  Medication Sig Dispense Refill  . ciprofloxacin (CIPRO) 500 MG tablet Take 1 tablet (500 mg total) by mouth daily with breakfast. 14 tablet 1  . docusate sodium (COLACE) 100 MG capsule Take 1 capsule (100 mg total) by mouth 2 (two) times daily. HOSPICE PATIENT 30 capsule 3  . fentaNYL (DURAGESIC - DOSED MCG/HR) 12 MCG/HR Place 1 patch (12.5 mcg total) onto the skin every 3 (  three) days. HOSPICE PATIENT 5 patch 0  . furosemide (LASIX) 20 MG tablet Take 20 mg by mouth daily.  3  . lactulose (CHRONULAC) 10 GM/15ML solution TAKE 15ML'S BY MOUTH TWICE A DAY AS NEEDED IF NO BOWEL MOVEMENT IN 24 HOURS. STOP IF HAVE BM  2  .  levothyroxine (SYNTHROID, LEVOTHROID) 88 MCG tablet Take by mouth.    Marland Kitchen LORazepam (ATIVAN) 0.5 MG tablet Take 0.5 mg by mouth every 6 (six) hours as needed. for anxiety  0  . morphine (ROXANOL) 20 MG/ML concentrated solution 0.5 - 1 ml every 2 -4 hours as needed for pain 30 mL 0  . polyethylene glycol powder (MIRALAX) powder Take 17 g by mouth daily. HOSPICE PATIENT 255 g 3  . spironolactone (ALDACTONE) 50 MG tablet Take 50 mg by mouth daily.    Marland Kitchen torsemide (DEMADEX) 20 MG tablet TAKE TWO TABLETS BY MOUTH ONCE DAILY 30 tablet 0   No current facility-administered medications for this visit.    OBJECTIVE:  Filed Vitals:   03/09/15 1542  BP: 97/61  Pulse: 89  Temp: 96.9 F (36.1 C)     Body mass index is 23.47 kg/(m^2).    ECOG FS:2 - Symptomatic, <50% confined to bed  PHYSICAL EXAM: Gen. status: Patient is in wheelchair.  Declining performance status.  Somewhat confused. GI: Increasing abdominal distention.  Patient had abdominal pearlescent test is 9 days ago.  Hospice is taking care but has not done any abdominal pearlescent phthisis I skin: Decubitus ulcer grade 2 Lungs: Diminished air entry on both sides Heart: Tachycardia Ataxia Neurological system intermittent confusion without any focal signs Lower extremity no edema   LAB RESULTS:  Appointment on 03/09/2015  Component Date Value Ref Range Status  . WBC 03/09/2015 29.3* 3.8 - 10.6 K/uL Final  . RBC 03/09/2015 4.07* 4.40 - 5.90 MIL/uL Final  . Hemoglobin 03/09/2015 9.7* 13.0 - 18.0 g/dL Final  . HCT 03/09/2015 30.3* 40.0 - 52.0 % Final  . MCV 03/09/2015 74.5* 80.0 - 100.0 fL Final  . MCH 03/09/2015 23.9* 26.0 - 34.0 pg Final  . MCHC 03/09/2015 32.1  32.0 - 36.0 g/dL Final  . RDW 03/09/2015 19.3* 11.5 - 14.5 % Final  . Platelets 03/09/2015 80* 150 - 440 K/uL Final  . Neutrophils Relative % 03/09/2015 48   Final  . Lymphocytes Relative 03/09/2015 8   Final  . Monocytes Relative 03/09/2015 8   Final  . Eosinophils  Relative 03/09/2015 0   Final  . Basophils Relative 03/09/2015 1   Final  . Band Neutrophils 03/09/2015 12   Final  . Metamyelocytes Relative 03/09/2015 7   Final  . Myelocytes 03/09/2015 12   Final  . Promyelocytes Absolute 03/09/2015 3   Final  . Blasts 03/09/2015 1   Final   [facility] CENTER CRITICAL VALUE PROTOCOL  . nRBC 03/09/2015 6* 0 /100 WBC Final  . Neutro Abs 03/09/2015 24.0* 1.4 - 6.5 K/uL Final  . Lymphs Abs 03/09/2015 2.3  1.0 - 3.6 K/uL Final  . Monocytes Absolute 03/09/2015 2.3* 0.2 - 1.0 K/uL Final  . Eosinophils Absolute 03/09/2015 0.0  0 - 0.7 K/uL Final  . Basophils Absolute 03/09/2015 0.3* 0 - 0.1 K/uL Final  . RBC Morphology 03/09/2015 POLYCHROMASIA PRESENT   Final   Comment: TEARDROP CELLS BASOPHILIC STIPPLING   . Smear Review 03/09/2015 GIANT PLATELETS SEEN   Final   Comment: LARGE PLATELETS PRESENT MIXED RBC POPULATION PLATELETS APPEAR DECREASED PENDING PATHOLOGIST REVIEW   .  Sodium 03/09/2015 131* 135 - 145 mmol/L Final  . Potassium 03/09/2015 4.4  3.5 - 5.1 mmol/L Final  . Chloride 03/09/2015 97* 101 - 111 mmol/L Final  . CO2 03/09/2015 24  22 - 32 mmol/L Final  . Glucose, Bld 03/09/2015 131* 65 - 99 mg/dL Final  . BUN 03/09/2015 56* 6 - 20 mg/dL Final  . Creatinine, Ser 03/09/2015 2.41* 0.61 - 1.24 mg/dL Final  . Calcium 03/09/2015 7.8* 8.9 - 10.3 mg/dL Final  . Total Protein 03/09/2015 6.6  6.5 - 8.1 g/dL Final  . Albumin 03/09/2015 3.2* 3.5 - 5.0 g/dL Final  . AST 03/09/2015 25  15 - 41 U/L Final  . ALT 03/09/2015 14* 17 - 63 U/L Final  . Alkaline Phosphatase 03/09/2015 113  38 - 126 U/L Final  . Total Bilirubin 03/09/2015 0.9  0.3 - 1.2 mg/dL Final  . GFR calc non Af Amer 03/09/2015 23* >60 mL/min Final  . GFR calc Af Amer 03/09/2015 27* >60 mL/min Final   Comment: (NOTE) The eGFR has been calculated using the CKD EPI equation. This calculation has not been validated in all clinical situations. eGFR's persistently <60 mL/min signify possible  Chronic Kidney Disease.   . Anion gap 03/09/2015 10  5 - 15 Final    No results found for: LABCA2 Lab Results  Component Value Date   CA199 7 11/25/2014   Lab Results  Component Value Date   CEA 0.9 11/25/2014   Lab Results  Component Value Date   PSA 1.59 12/29/2014    STUDIES: No results found.  ASSESSMENT: 81 year old gentleman with ascites most likely due to cirrhosis of liver however underlying malignancy cannot be ruled out. Myeloproliferative disease Patient is under hospice care. Abdominal pain is under control Decubitus ulcer: Discussed situation with hospice trying to use DuoDERM. Marland Kitchen  Also trying to make an appointment with our wound care nurse to see whether any other intervention can be helpful Also called hospice and asked them to get abdominal paracentesis is done 500-7 50 cc of fluid removed twice a week .    MEDICAL DECISION MAKING:  All lab data has been reviewed.  Discussed situation with family hospice nurses.  We will arrange for the wound care nurse evaluation.    Patient expressed understanding and was in agreement with this plan. He also understands that He can call clinic at any time with any questions, concerns, or complaints.    No matching staging information was found for the patient.  Forest Gleason, MD   03/09/2015 4:26 PM

## 2015-03-09 NOTE — Progress Notes (Signed)
Called hospice while pt in office and asked them to come out and drain ascites through pleurex catheter twice a week and drain 500-750 ml at a time.  Also asked them to look at open sores on bottom and see if duoderm would be beneficial because creams that were sent out is not helping per wife.  Spoke to [place] in triage at [place] is his nurse and she will give message and plans ongoing out to house tom. They will get in touch with family

## 2015-03-16 LAB — PATHOLOGIST SMEAR REVIEW

## 2015-03-21 ENCOUNTER — Other Ambulatory Visit: Payer: Self-pay | Admitting: *Deleted

## 2015-03-26 ENCOUNTER — Telehealth: Payer: Self-pay | Admitting: *Deleted

## 2015-03-26 DIAGNOSIS — D474 Osteomyelofibrosis: Secondary | ICD-10-CM

## 2015-03-26 MED ORDER — FENTANYL 12 MCG/HR TD PT72: 12.5000 ug | MEDICATED_PATCH | TRANSDERMAL | Status: DC

## 2015-03-26 NOTE — Telephone Encounter (Signed)
faxed

## 2015-03-26 NOTE — Telephone Encounter (Signed)
[place] patient 

## 2015-04-05 ENCOUNTER — Inpatient Hospital Stay: Admitting: Oncology

## 2015-04-06 ENCOUNTER — Ambulatory Visit: Payer: Medicare Other | Admitting: Oncology

## 2015-04-08 ENCOUNTER — Telehealth: Payer: Self-pay | Admitting: *Deleted

## 2015-04-08 DIAGNOSIS — D474 Osteomyelofibrosis: Secondary | ICD-10-CM

## 2015-04-08 MED ORDER — FENTANYL 12 MCG/HR TD PT72: 12.5000 ug | MEDICATED_PATCH | TRANSDERMAL | Status: DC

## 2015-04-08 NOTE — Telephone Encounter (Signed)
Hold drainage if sbp <90 left vm for Vivien Rota Rx faxed

## 2015-04-08 NOTE — Telephone Encounter (Signed)
Needs b/p parameters for draining his pleurex. His SBP hangs at 100 and usually drops 10 points after draining

## 2015-04-13 ENCOUNTER — Other Ambulatory Visit: Payer: Self-pay | Admitting: *Deleted

## 2015-04-13 DIAGNOSIS — D474 Osteomyelofibrosis: Secondary | ICD-10-CM

## 2015-04-13 MED ORDER — MORPHINE SULFATE (CONCENTRATE) 20 MG/ML PO SOLN: ORAL | Status: AC

## 2015-04-13 MED ORDER — FENTANYL 12 MCG/HR TD PT72: 12.5000 ug | MEDICATED_PATCH | TRANSDERMAL | Status: AC

## 2015-05-08 DEATH — deceased

## 2015-10-30 IMAGING — CT NM PET TUM IMG INITIAL (PI) SKULL BASE T - THIGH
1 of 10 series · 1 of 25 positions shown · non-contrast
Comparison: 11/24/2014

CLINICAL DATA: Initial treatment strategy for peritoneal
carcinomatosis. History of myelofibrosis.

EXAM:
NUCLEAR MEDICINE PET SKULL BASE TO THIGH
TECHNIQUE: 12.8 mCi F-18 FDG was injected intravenously. Full-ring PET imaging
was performed from the skull base to thigh after the radiotracer. CT
data was obtained and used for attenuation correction and anatomic
localization.
FASTING BLOOD GLUCOSE:  Value: 101 mg/dl

[Series 3: ct wb 5.0 b30f · axial · 5.0mm · 0.98mm/px · 1 of 329 slices shown]
[im 329/329  brain]
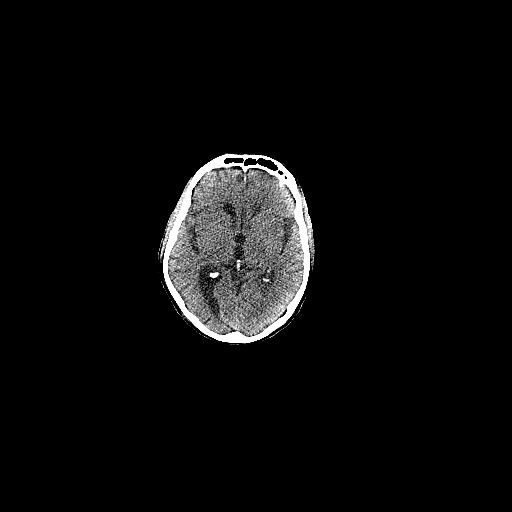

[1 of 25 positions shown; findings below may reference images not displayed]

FINDINGS: NECK

No areas of abnormal hypermetabolism.

CHEST

No areas of abnormal hypermetabolism.

ABDOMEN/PELVIS

splenic uptake is borderline to mildly increased (relatively equal
to the liver). No focal hypermetabolism within the omentum/
peritoneum to correspond to the areas of diffuse peritoneal
thickening. No malignant range hypermetabolism within the ascites.

Soft tissue thickening in the gastric cardia on the prior CT is
without hypermetabolic PET correlate. Similarly, the soft tissue
thickening along the origin of the superior mesenteric artery is
without hypermetabolic correlate. No primary malignancy source is
identified.

SKELETON

No abnormal marrow activity.

CT IMAGES PERFORMED FOR ATTENUATION CORRECTION

Normal limited intracranial imaging. No cervical adenopathy. Left
carotid atherosclerosis.

Multivessel coronary artery atherosclerosis. Mild cardiomegaly. Left
lower lobe calcified granuloma.

Splenomegaly. Old granulomatous disease in the spleen.
Cholelithiasis. Decrease in small volume abdominal pelvic ascites.
Retroperitoneal soft tissue thickening, including surrounding the
superior mesenteric artery. Low in density, possibly representing
edema or fluid.

Interpolar right renal low-density lesion which is likely a cyst.
Left greater than right renal cortical thinning. Omental/peritoneal
thickening which is slightly improved. Mild prostatomegaly. Large
right hydrocele.

Heterogeneous marrow density, likely related to the clinical history
of myelofibrosis. Cervical spine fixation.
IMPRESSION: 1. No hypermetabolic correlate for the omental/peritoneal thickening
or abdominal pelvic ascites. Based on CT appearance, this remains
suspicious for peritoneal carcinomatosis. Alternate explanations,
including portal venous hypertension (correlate with risk factors
for mild cirrhosis), or peritoneal infection should be considered.
No primary malignancy site identified, including at the gastric
cardia.
2. The retroperitoneal soft tissue fullness is again identified and
is not hypermetabolic. Appears low in density, possibly relating to
edema or small volume fluid. This warrants followup attention.
3. Splenomegaly with nonspecific borderline/mild hypermetabolism.
This may relate to the clinical history of myelofibrosis.
4.  Atherosclerosis, including within the coronary arteries.
